# Patient Record
Sex: Male | Born: 1985 | Race: White | Hispanic: Yes | Marital: Married | Smoking: Never smoker
Health system: Southern US, Community
[De-identification: ages and names within clinical notes are randomized; demographics above are authoritative.]

## PROBLEM LIST (undated history)

## (undated) DIAGNOSIS — R569 Unspecified convulsions: Secondary | ICD-10-CM

---
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
## 2007-12-30 ENCOUNTER — Emergency Department: Payer: Self-pay | Admitting: Internal Medicine

## 2008-04-21 ENCOUNTER — Emergency Department: Payer: Self-pay | Admitting: Emergency Medicine

## 2008-05-04 ENCOUNTER — Ambulatory Visit: Payer: Self-pay | Admitting: Internal Medicine

## 2008-05-25 ENCOUNTER — Emergency Department: Payer: Self-pay | Admitting: Emergency Medicine

## 2008-05-26 ENCOUNTER — Emergency Department: Payer: Self-pay | Admitting: Emergency Medicine

## 2008-08-09 ENCOUNTER — Emergency Department: Payer: Self-pay | Admitting: Emergency Medicine

## 2008-08-13 ENCOUNTER — Emergency Department: Payer: Self-pay | Admitting: Emergency Medicine

## 2008-09-10 ENCOUNTER — Emergency Department: Payer: Self-pay | Admitting: Emergency Medicine

## 2008-11-23 ENCOUNTER — Emergency Department: Payer: Self-pay | Admitting: Emergency Medicine

## 2008-12-12 ENCOUNTER — Other Ambulatory Visit: Payer: Self-pay | Admitting: Ophthalmology

## 2008-12-17 ENCOUNTER — Emergency Department: Payer: Self-pay | Admitting: Emergency Medicine

## 2009-01-23 ENCOUNTER — Emergency Department: Payer: Self-pay | Admitting: Emergency Medicine

## 2009-10-03 ENCOUNTER — Emergency Department: Payer: Self-pay | Admitting: Emergency Medicine

## 2009-10-19 ENCOUNTER — Emergency Department: Payer: Self-pay | Admitting: Emergency Medicine

## 2009-11-09 ENCOUNTER — Emergency Department: Payer: Self-pay | Admitting: Emergency Medicine

## 2010-04-01 ENCOUNTER — Inpatient Hospital Stay: Payer: Self-pay | Admitting: Internal Medicine

## 2010-07-11 ENCOUNTER — Emergency Department: Payer: Self-pay | Admitting: Internal Medicine

## 2010-07-14 ENCOUNTER — Emergency Department: Payer: Self-pay | Admitting: Emergency Medicine

## 2011-01-27 ENCOUNTER — Emergency Department: Payer: Self-pay | Admitting: Psychiatry

## 2011-03-19 ENCOUNTER — Emergency Department: Payer: Self-pay | Admitting: Emergency Medicine

## 2011-05-14 ENCOUNTER — Emergency Department: Payer: Self-pay | Admitting: Emergency Medicine

## 2011-08-29 ENCOUNTER — Emergency Department: Payer: Self-pay | Admitting: Emergency Medicine

## 2011-08-29 LAB — APTT: Activated PTT: 38.7 secs — ABNORMAL HIGH (ref 23.6–35.9)

## 2011-08-29 LAB — URINALYSIS, COMPLETE
Bacteria: NONE SEEN
Bilirubin,UR: NEGATIVE
Glucose,UR: NEGATIVE mg/dL (ref 0–75)
Leukocyte Esterase: NEGATIVE
RBC,UR: 1 /HPF (ref 0–5)

## 2011-08-29 LAB — PROTIME-INR: INR: 1.1

## 2011-08-29 LAB — COMPREHENSIVE METABOLIC PANEL
Albumin: 4 g/dL (ref 3.4–5.0)
Alkaline Phosphatase: 75 U/L (ref 50–136)
Anion Gap: 9 (ref 7–16)
BUN: 19 mg/dL — ABNORMAL HIGH (ref 7–18)
Bilirubin,Total: 0.7 mg/dL (ref 0.2–1.0)
Calcium, Total: 8.7 mg/dL (ref 8.5–10.1)
Chloride: 105 mmol/L (ref 98–107)
EGFR (Non-African Amer.): >60
Glucose: 112 mg/dL — ABNORMAL HIGH (ref 65–99)
Potassium: 3.9 mmol/L (ref 3.5–5.1)
SGOT(AST): 30 U/L (ref 15–37)
SGPT (ALT): 67 U/L

## 2011-08-29 LAB — CBC
HCT: 47 % (ref 40.0–52.0)
MCHC: 33.5 g/dL (ref 32.0–36.0)
MCV: 91 fL (ref 80–100)
Platelet: 174 10*3/uL (ref 150–440)
RDW: 13.4 % (ref 11.5–14.5)

## 2012-01-19 ENCOUNTER — Emergency Department: Payer: Self-pay | Admitting: Emergency Medicine

## 2012-08-07 ENCOUNTER — Inpatient Hospital Stay: Payer: Self-pay | Admitting: Internal Medicine

## 2012-08-07 LAB — URINALYSIS, COMPLETE
Bilirubin,UR: NEGATIVE
Glucose,UR: NEGATIVE mg/dL (ref 0–75)
Ketone: NEGATIVE
Nitrite: NEGATIVE
Ph: 5 (ref 4.5–8.0)
Protein: NEGATIVE
RBC,UR: 3 /HPF (ref 0–5)
WBC UR: 10 /HPF (ref 0–5)

## 2012-08-07 LAB — COMPREHENSIVE METABOLIC PANEL
Albumin: 4 g/dL (ref 3.4–5.0)
Alkaline Phosphatase: 104 U/L (ref 50–136)
BUN: 14 mg/dL (ref 7–18)
Calcium, Total: 9 mg/dL (ref 8.5–10.1)
Co2: 26 mmol/L (ref 21–32)
Osmolality: 273 (ref 275–301)
SGPT (ALT): 45 U/L (ref 12–78)

## 2012-08-07 LAB — CBC
HCT: 43.9 % (ref 40.0–52.0)
HGB: 15.2 g/dL (ref 13.0–18.0)
MCHC: 34.5 g/dL (ref 32.0–36.0)
MCV: 90 fL (ref 80–100)
RDW: 13.1 % (ref 11.5–14.5)
WBC: 15.2 10*3/uL — ABNORMAL HIGH (ref 3.8–10.6)

## 2012-08-07 LAB — LIPASE, BLOOD: Lipase: 84 U/L (ref 73–393)

## 2012-08-08 LAB — CBC WITH DIFFERENTIAL/PLATELET
Basophil #: 0 10*3/uL (ref 0.0–0.1)
Basophil %: 0.1 %
Eosinophil #: 0 10*3/uL (ref 0.0–0.7)
Lymphocyte #: 1.6 10*3/uL (ref 1.0–3.6)
Lymphocyte %: 10.7 %
MCH: 31 pg (ref 26.0–34.0)
Monocyte #: 1.4 x10 3/mm — ABNORMAL HIGH (ref 0.2–1.0)
Neutrophil #: 11.6 10*3/uL — ABNORMAL HIGH (ref 1.4–6.5)
Neutrophil %: 79.5 %
RBC: 4.71 10*6/uL (ref 4.40–5.90)
RDW: 13.1 % (ref 11.5–14.5)
WBC: 14.6 10*3/uL — ABNORMAL HIGH (ref 3.8–10.6)

## 2012-08-08 LAB — TROPONIN I: Troponin-I: <0.02 ng/mL

## 2012-08-08 LAB — BASIC METABOLIC PANEL
BUN: 14 mg/dL (ref 7–18)
Chloride: 99 mmol/L (ref 98–107)
Co2: 28 mmol/L (ref 21–32)
EGFR (African American): >60
Potassium: 4 mmol/L (ref 3.5–5.1)

## 2012-08-09 LAB — CBC WITH DIFFERENTIAL/PLATELET
Basophil #: 0 10*3/uL (ref 0.0–0.1)
Eosinophil #: 0 10*3/uL (ref 0.0–0.7)
HCT: 38.6 % — ABNORMAL LOW (ref 40.0–52.0)
Lymphocyte #: 1.8 10*3/uL (ref 1.0–3.6)
Lymphocyte %: 12.9 %
MCH: 31.1 pg (ref 26.0–34.0)
MCV: 90 fL (ref 80–100)
Monocyte #: 1.4 x10 3/mm — ABNORMAL HIGH (ref 0.2–1.0)
Monocyte %: 10 %
Neutrophil %: 76.6 %
Platelet: 184 10*3/uL (ref 150–440)
RBC: 4.3 10*6/uL — ABNORMAL LOW (ref 4.40–5.90)
RDW: 12.9 % (ref 11.5–14.5)
WBC: 13.7 10*3/uL — ABNORMAL HIGH (ref 3.8–10.6)

## 2012-08-09 LAB — URINE CULTURE

## 2012-08-13 LAB — WOUND CULTURE

## 2012-08-13 LAB — CULTURE, BLOOD (SINGLE)

## 2012-08-31 ENCOUNTER — Emergency Department: Payer: Self-pay | Admitting: Emergency Medicine

## 2013-03-25 ENCOUNTER — Emergency Department: Payer: Self-pay | Admitting: Emergency Medicine

## 2013-03-25 LAB — URINALYSIS, COMPLETE
Bacteria: NONE SEEN
Bilirubin,UR: NEGATIVE
Blood: NEGATIVE
Ketone: NEGATIVE
Leukocyte Esterase: NEGATIVE
Nitrite: NEGATIVE
Protein: NEGATIVE

## 2013-06-10 ENCOUNTER — Emergency Department: Payer: Self-pay | Admitting: Emergency Medicine

## 2014-02-02 ENCOUNTER — Emergency Department: Payer: Self-pay | Admitting: Emergency Medicine

## 2014-02-18 ENCOUNTER — Emergency Department: Payer: Self-pay | Admitting: Internal Medicine

## 2014-07-21 NOTE — Consult Note (Signed)
PATIENT NAME:  Daniel Contreras, Daniel Contreras MR#:  [id number] DATE OF BIRTH:  1985/11/07  DATE OF CONSULTATION:  08/07/2012  CONSULTING PHYSICIAN:  Loraine LericheMark A. Egbert GaribaldiBird, MD  REASON FOR CONSULTATION: Fever, leukocytosis and anal pain.   HISTORY: This is a 29 year old otherwise healthy white male who presents with a 3-day history of worsening perianal pain, difficulty having bowel movement. He has had some intermittent dysuria as well. The patient has never had symptoms like this before. The patient had a fever to 101.3. A CAT scan in the Emergency Room demonstrates what appears to be a perianal abscess. The patient is very tender on examination to both myself and the Emergency Room physician.   ALLERGIES: None.  HOME MEDICATIONS:  None.  PAST MEDICAL HISTORY: None.   SOCIAL HISTORY: He is married, works in [employer], is Hispanic.   PHYSICAL EXAMINATION: GENERAL: The patient is disheveled. No obvious distress, in slight pain.  VITAL SIGNS: Temperature is 101.3, pulse 101, respiratory rate 18, blood pressure 127/67, 5 feet 8 inches, 177 pounds.  LUNGS: Clear.  HEART: Regular rate and rhythm except for tachycardia.  ABDOMEN: Soft and nontender.  RECTAL: Examination was limited secondary to pain. There is exquisite tenderness on superficial palpation to the left lateral perianal sidewall. No obvious perianal abscess. No obvious cellulitis seen.   LABORATORY, DIAGNOSTIC AND RADIOLOGICAL DATA:  Electrolytes are unremarkable. Liver function tests normal. White count is 15.2, platelet count 197,000. Urinalysis is negative except for 10 WBCs per high-power field.   IMPRESSION:  Perianal abscess, likely  intersphincteric in location, significant symptoms, leukocytosis and fever.   PLAN: The patient has been admitted to the medical service for intravenous antibiotics and pain control. Plan is for incision and drainage, examination under anesthesia in the morning. I discussed with him the procedure, how it was  done under general anesthesia and all his questions were answered.   ____________________________ Redge GainerMark A. Egbert GaribaldiBird, MD mab:cb D: 08/07/2012 21:38:20 ET T: 08/08/2012 14:18:19 ET JOB#: [id number]  cc: Loraine LericheMark A. Egbert GaribaldiBird, MD, <Dictator> Lowanda Cashaw A Malacai Grantz MD ELECTRONICALLY SIGNED 08/08/2012 20:29

## 2014-07-21 NOTE — H&P (Signed)
PATIENT NAME:  Daniel Contreras, Daniel Contreras MR#:  [id number] DATE OF BIRTH:  May 21, 1985  DATE OF ADMISSION:  08/07/2012  REFERRING PHYSICIAN: Dr. Margarita Grizzle.   PRIMARY CARE PHYSICIAN: None.    CHIEF COMPLAINT: Perirectal pain, and severe constipation.   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 Last seizure over a year ago.   The patient comes today with a history starting with rectal pain for 3 or 4 days, increasing in intensity and duration. Has not gone away. Intensity of 9 out of 10. The patient is unable to have a bowel movement due to the severity of the pain. He states he has not had a bowel movement in over 4 days.   The patient states that he has been having fevers at home, chills and feeling very poorly.   He has pains all over, especially when he has the chills.   The patient overall was evaluated by the ER physician. Rectal exam shows significant tenderness at the level of the anterior and posterior rectal walls.   Because of all of the severity of the symptoms and significant pain, the patient got a CT scan. The CT scan showed a normal-sized prostate with a loculated perirectal abscess on the posterior wall of the rectum.   The patient states that he has not been eating any abnormal foods. He occasionally eats fish or chicken. Has not really felt like eating due to fullness/ constipation. and he has not changed his diet significantly. He works in [employer], and he says that he drinks some fluids occasionally, but  not enough. The patient is admitted for further evaluation. Surgery was consulted as well.   REVIEW OF SYSTEMS:   CONSTITUTIONAL: Positive fever. Positive malaise. Positive myalgias. Positive dehydration. Negative weight loss or weight gain.  EYES: No double vision or blurry vision; inflammation of the eyes.  ENT: No tinnitus. No  difficulty in swallowing. No significant snoring.  RESPIRATORY: No cough. No wheezing. No hemoptysis. No painful respirations.  CARDIOVASCULAR: No chest pain, orthopnea, edema or syncopal episodes.  GASTROINTESTINAL: No nausea or vomiting. No diarrhea. Positive constipation. Positive perirectal pain. No previous hematemesis, rectal bleeding. No jaundice.  GENITOURINARY: No hematuria. Positive mild dysuria. No history of kidney stones. No changes in frequency. No previous history of prostatitis. No history of STDs in the past. The patient states that he has had only 1 sexual partner for several years, who is his wife who is currently pregnant.  ENDOCRINOLOGIC: No polyuria, polydipsia or polyphagia. No cold or heat intolerance. HEMATOLOGIC/LYMPHATIC: No anemia, easy bruising or bleeding.  SKIN: No rashes or petechiae.  MUSCULOSKELETAL: No significant back pain, neck pain. Positive myalgias all over. No gout.  NEUROLOGIC: No numbness, tingling. No CVAs or transient ischemic attacks.  PSYCHIATRIC: No significant anxiety or depression or history of other psychiatric issues.   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 The patient himself out of treatment.   ALLERGIES: No known drug allergies.   MEDICATIONS: The patient takes occasional ibuprofen for pain.   SOCIAL HISTORY: The patient does not smoke, does not drink. He works in [employer]. He has his wife, 1 baby with her, and she is now pregnant.   SURGICAL HISTORY: The patient states that he had surgery on his foot with soft tissue of the foot, but he does not know what type of surgery it was.  FAMILY HISTORY: Negative for MI. Positive for a stroke in his mother. Negative for diabetes.   PHYSICAL EXAMINATION  VITAL SIGNS: Temperature of 101.3, pulse of 101, respirations 18, blood pressure 126/73, oxygen saturation 98% on room air.   GENERAL: The patient is alert, oriented x 3, mild distress due  to significant pain. Hemodynamically stable. The patient looks slightly dehydrated.  HEENT: Pupils are equal and reactive. Extraocular movements are intact. Mucosa are moist. Anicteric sclerae. Pink conjunctivae. No oral lesions. No oropharyngeal exudates.  NECK: Supple. No JVD. No thyromegaly. No adenopathy. No carotid bruits. No rigidity.  CARDIOVASCULAR: Regular rate and rhythm. No murmurs, rubs, or gallops. No tenderness to palpation of anterior chest wall.  LUNGS: Clear, without any wheezing or crepitus. No use of accessory muscles.  ABDOMEN: Soft, nontender, nondistended. No hepatosplenomegaly. No masses. Bowel sounds are positive.  RECTAL EXAM: Deferred, as he already had a rectal exam done by ER physician. By ER physician, prostate and posterior wall was really tender. No cellulitis outside the rectum. No hemorrhoids.  EXTREMITIES: No edema, no cyanosis, no clubbing. Pulses +2. Capillary refill less than 3.  SKIN: Without any rashes or petechiae. There is some flushing at the level of the face and neck, likely due to fever.  LYMPHATICS: Negative for lymphadenopathy in the neck or supraclavicular areas, or groin area.  NEUROLOGIC: Cranial nerves II-XII intact. Strength is 5/5 in all 4 extremities.  MOOD: The patient is stable. Alert, oriented x 3. No agitation.   RESULTS: Glucose 105, BUN 14, creatinine 0.95, chloride 104, potassium 3.5, osmolality 273, total protein 7.8.   White count is 15,000, hemoglobin is 15, platelets 197.   UA only shows 10 red blood cells. Leukocyte esterase is trace.   CT scan, as mentioned above, perirectal abscess.   ASSESSMENT AND PLAN: A 29 year old gentleman with a history of previous seizures, no longer on treatment, comes with perirectal pain, fever, tachycardia, increased white blood cells, significant pain.   1.  Systemic inflammatory response syndrome: The patient meet criteria based on  tachycardia, increased white blood cells and fever. This is  likely due to perirectal abscess. Patient covered  with broad-spectrum antibiotics. The patient has been given Levaquin because of suspicion of prostatitis, but now with CT scan with abscessed, would like to bring up the coverage and use Zosyn.  2.  Surgical consultation for pain.  3.  The patient has a perirectal abscess at this moment. The amount of fluid may be small to moderate. We are going to continue to treat with IV antibiotics. Blood cultures taken. Try to give comfort with pain medications; morphine, nausea medications and alleviate this significant constipation. The patient needs to have release of stool, for which what I am going to add on MiraLAX twice daily.  4.  For now, the patient is going to be n.p.o. except for medications in the light of getting a surgical consultation.  5. History of seizures in the past: The patient is to follow up with primary care physician and primary care neurologist to get a new evaluation if he needs to be on or off this medication if  he was on Keppra.  6.  DVT prophylaxis: Mechanical compression devices, as the patient could have surgery.  7.  GI prophylaxis, with Protonix.   I spent about 45 minutes with this patient.    ____________________________ Felipa Furnaceoberto Sanchez Gutierrez, MD rsg:dm D: 08/07/2012 21:04:17 ET T: 08/08/2012 07:30:01 ET JOB#: [id number]  cc: Felipa Furnaceoberto Sanchez Gutierrez, MD, <Dictator> Neilani Duffee  Juanda Chance MD ELECTRONICALLY SIGNED 08/09/2012 12:11

## 2014-07-21 NOTE — Op Note (Signed)
PATIENT NAME:  Daniel Contreras, Daniel Contreras MR#:  [id number] DATE OF BIRTH:  04-23-1985  DATE OF PROCEDURE:  08/08/2012  PREOPERATIVE DIAGNOSIS: Perirectal abscess.  POSTOPERATIVE DIAGNOSIS:  Perirectal abscess.  PROCEDURE:  Incision and drainage of perirectal abscess, examination under anesthesia.   SURGEON: Dionne Miloichard Kelyn Koskela, M.D.   ANESTHESIA: General with LMA.   INDICATIONS: This is a patient with painful right-sided mass, which has worsened, and a CT scan suggestive of perirectal abscess. Preoperatively, we discussed rationale for surgery, the options of observation, risk of bleeding, infection and recurrence. He understood and agreed to proceed.   FINDINGS: Small abscess cavity. Cultures were obtained. This was on the right side. Packing was placed.   DESCRIPTION OF PROCEDURE: The patient was induced to general anesthesia, placed into a high lithotomy position, and then prepped and draped in sterile fashion.  A rectal exam was performed, which was normal. There seemed to be some mass affect on the right side.  However, this coincided with mass on the right perianal area.   Incision and drainage was performed with a 10 blade.  A small amount of purulence exuded and clamp was placed into a small cavity through this wound.  A small amount of purulence exuded. Cultures were obtained.  Into this cavity was placed 1/2 inch Nu Gauze dressing and ABD pad was placed. He was taken out of lithotomy position and mesh panties were placed.   The patient tolerated the procedure well. There were no complications. He was taken to the recovery room in stable condition to be admitted for continued care.  ____________________________ Adah Salvageichard E. Excell Seltzerooper, MD rec:sb D: 08/08/2012 10:15:47 ET T: 08/09/2012 07:51:34 ET JOB#: [id number]  cc: Adah Salvageichard E. Excell Seltzerooper, MD, <Dictator> Lattie HawICHARD E Chrystian Cupples MD ELECTRONICALLY SIGNED 08/11/2012 14:44

## 2014-07-21 NOTE — Discharge Summary (Signed)
PATIENT NAME:  Daniel LaymanJAIMES SILVA, [place] MR#:  [id number] DATE OF BIRTH:  March 09, 1986  DATE OF ADMISSION:  08/07/2012 DATE OF DISCHARGE:    DISCHARGE DIAGNOSES: 1.  A 1.6 and 1.8 cm perirectal abscess.  2.  Sepsis.  3.  Constipation.   IMAGING STUDIES: Includes a CT scan of the abdomen and pelvis with contrast, which showed perirectal abscess.   CONSULTANTS: Dr. Excell Seltzerooper and Dr. Juliann PulseLundquist of surgery.   PROCEDURES: I and D of the perirectal abscess.   ADMITTING HISTORY AND PHYSICAL AND HOSPITAL COURSE: Please see detailed H and P dictated previously. In brief, a 29 year old Spanish speaking Hispanic male presented to the hospital complaining of pain around his rectal area along with constipation. The patient was found to have a swelling. CT scan showed perirectal abscess. The patient was admitted for IV antibiotics and I and D was done by Dr. Excell Seltzerooper of surgery. Later, he was followed by Dr. Juliann PulseLundquist of surgery who suggested no packing of the wound. Follow up with him in 7 to 10 days as outpatient. The patient was initially on Zosyn, later switched to Augmentin. The patient is doing well with sepsis resolved. Constipation has resolved with MiraLAX and Colace. He is being discharged home. Today, his cardiac and lung examination showed clear breath sounds and S1, S2, without any murmurs. Afebrile.   DISCHARGE MEDICATIONS: 1.  Acetaminophen 325 mg 2 tablets every 4 hours as needed for pain or temperature.  2. Acetaminophen/hydrocodone 325/5, one tablet orally every 6 hours as needed for pain.  3.  Docusate sodium 100 mg 2 capsules oral 2 times a day as needed.  4.  MiraLAX 17 grams oral once a day as needed for constipation.  5.  Augmentin 875 mg 1 tablet oral 2 times a day.   DISCHARGE INSTRUCTIONS: Regular diet with high fiber. Avoid any kind of constipation and use laxatives as prescribed. The patient has been given a work note and will be off work until 08/16/2012.   TIME SPENT TODAY ON THIS  DISCHARGE ACTIVITY: Was 41 minutes   ____________________________ Molinda BailiffSrikar R. Pruitt Taboada, MD srs:cc D: 08/10/2012 15:14:58 ET T: 08/10/2012 17:18:30 ET JOB#: [id number]  cc: Wardell HeathSrikar R. Lum Stillinger, MD, <Dictator> Orie FishermanSRIKAR R Rachella Basden MD ELECTRONICALLY SIGNED 08/11/2012 14:00

## 2014-07-21 NOTE — Consult Note (Signed)
Brief Consult Note: Diagnosis: perianal abscess, likley intersphinecteric in location, leukocytosis.   Patient was seen by consultant.   Consult note dictated.   Recommend further assessment or treatment.   Discussed with Attending MD.   Comments: Best drained in the OR.  SPoke with patient and he agrees plan for I and D in am under GETA.  Electronic Signatures: Natale LayBird, Yoltzin Barg (MD)  (Signed 10-May-14 21:35)  Authored: Brief Consult Note   Last Updated: 10-May-14 21:35 by Natale LayBird, Rajvir Ernster (MD)

## 2014-10-16 ENCOUNTER — Encounter: Payer: Self-pay | Admitting: Emergency Medicine

## 2014-10-16 ENCOUNTER — Emergency Department: Payer: Self-pay

## 2014-10-16 ENCOUNTER — Emergency Department
Admission: EM | Admit: 2014-10-16 | Discharge: 2014-10-16 | Disposition: A | Discharge status: Home / Self Care | Payer: Self-pay | Attending: Emergency Medicine | Admitting: Emergency Medicine

## 2014-10-16 DIAGNOSIS — R519 Headache, unspecified: Secondary | ICD-10-CM

## 2014-10-16 DIAGNOSIS — R51 Headache: Secondary | ICD-10-CM | POA: Insufficient documentation

## 2014-10-16 MED ORDER — BUTALBITAL-APAP-CAFFEINE 50-325-40 MG PO TABS: 1.0000 | ORAL_TABLET | Freq: Four times a day (QID) | ORAL | Status: AC | PRN

## 2014-10-16 MED ORDER — KETOROLAC TROMETHAMINE 30 MG/ML IJ SOLN
30.0000 mg | Freq: Once | INTRAMUSCULAR | Status: AC
  Administered 2014-10-16: 30 mg via INTRAMUSCULAR
  Filled 2014-10-16: qty 1

## 2014-10-16 NOTE — Discharge Instructions (Signed)
Dolor de cabeza, preguntas frecuentes y sus respuestas °(Headaches, Frequently Asked Questions) °CEFALEAS MIGRAÑOSAS °P: ¿Qué es la migraña? ¿Qué la ocasiona? ¿Cómo puedo tratarla? °R: En general, la migraña comienza como un dolor apagado. Luego progresa hacia un dolor, constante, punzante y como un latido. Sentirá este dolor en las sienes. Podrá sentir dolor en la parte anterior o posterior de la cabeza, o en uno o ambos lados. El dolor suele estar acompañado de una combinación de: °· Náuseas. °· Vómitos. °· Sensibilidad a la luz y los ruidos. °Algunas personas (un 15%) experimentan un aura (ver abajo) antes de un ataque. La causa de la migraña se debe a reacciones químicas del cerebro. El tratamiento para la migraña puede incluir medicamentos de venta libre. También puede incluir técnicas de autoayuda. Estas incluyen entrenamientos para la relajación y biorretroalimentación.  °P: ¿Qué es un aura? °R: Alrededor del 15% de las personas con migraña tiene un "aura". Es una señal de síntomas neurológicos que ocurren antes de un dolor de cabeza por migrañas. Podrá ver líneas onduladas o irregulares, puntos o luces parpadeantes. Podrá experimentar visión de túnel o puntos ciegos en uno o ambos ojos. El aura puede incluir alucinaciones visuales o auditivas (algo que se imagina). Puede incluir trastornos en el olfato (como olores extraños), el tacto o el gusto. Entre otros síntomas se incluyen: °· Adormecimiento. °· Sensación de hormigueo. °· Dificultad para recordar o decir la palabra correcta. °Estos episodios neurológicos pueden durar hasta 60 minutos. Los síntomas desaparecerán a medida que el dolor de cabeza comience. °P:¿Qué es un disparador? °R: Ciertos factores físicos o ambientales pueden llevar a "disparar" una migraña. Estos son: °· Alimentos. °· Cambios hormonales. °· Clima. °· Estrés. °Es importante recordar que los disparadores son diferentes entre si. Para ayudar a prevenir ataques de migrañas, necesitará  descubrir cuáles son los disparadores que le afectan. Lleve un diario sobre sus dolores de cabeza. Este es un buen modo para descubrir los disparadores. El diario le ayudará en el momento de hablar con el profesional acerca de su enfermedad. °P: ¿El clima afecta en las migrañas? °R: La luz solar, el calor, la humedad y lo cambios drásticos en la presión barométrica pueden llevar a, o "disparar" un ataque de migraña en algunas personas. Pero estudios han demostrado que el clima no actúa como disparador para todas las personas con migraña. °P: ¿Cuál es la relación entre la migraña y la hormonas? °R: Las hormonas inician y regulan muchas de las funciones corporales. Las hormonas mantienen el balance en el cuerpo dentro de los constantes cambios de ambiente. Algunas veces, el nivel de hormonas en el cuerpo se desbalancea. Por ejemplo, durante la menstruación, el embarazo o la menopausia. Pueden ser la causa de un ataque de migraña. De hecho, alrededor de tres cuartos de las mujeres con migraña informan que sus ataques están relacionados con el ciclo menstrual.  °P: ¿Aumenta el riesgo de sufrir un choque cardíaco en las personas que padecen migraña? °R: La probabilidad de que un ataque de migraña ocasione un ataque cardíaco es muy remota. Esto no quiere decir que una persona que sufre de migraña no pueda tener un ataque cardíaco asociado con ella. En las personas menores de 40 años, el factor más común para un ataque es la migraña. Pero durante la vida de una persona, la ocurrencia de un dolor de cabeza por migraña está asociada con una reducción en el riesgo de morir por un ataque cerebrovascular.  °P: ¿Cuáles son los medicamentos para la migraña? °R: La   medicación precisa se utiliza para tratar el dolor de cabeza una vez que ha comenzado. Son ejemplos, medicamentos de venta libre, desinflamatorios sin esteroides, ergotamínicos y triptanos.  °P: ¿Qué son los triptanos? °R: Lo triptanos son una nueva clase de  medicamentos abortivos. Son específicos para tratar este problema. Los triptanos son vasoconstrictores. Moderan algunas reacciones químicas del cerebro. Los triptanos trabajan como receptores del cerebro. Ayudan a restaurar el balance de un neurotransmisor denominado serotonina. Se cree que las fluctuaciones en los niveles de serotonina son la causa principal de la migraña.  °P: ¿Son efectivos los medicamentos de venta libre para la migraña? °R: Los medicamentos de venta libre pueden ser efectivos para aliviar dolores leves a moderados y los síntomas asociados a la migraña. Pero deberá consultar a un médico antes de comenzar cualquier tratamiento para la migraña.  °P: ¿Cuáles son los medicamentos de prevención de la migraña? °R: Se suele denominar tratamiento "profiláctico" a los medicamentos para la prevención de la migraña. Se utilizan para reducir la frecuencia, gravedad y duración de los ataques de migraña. Son ejemplos de medicamentos de prevención: antiepilépticos, antidepresivos, bloqueadores beta, bloqueadores de los canales de calcio y medicamentos antiinflamatorios sin esteroides. °P: ¿ Por qué se utilizan anticonvulsivantes para tratar la migraña? °R: Durante los últimos años, ha habido un creciente interés en las drogas antiepilépticas para la prevención de la migraña. A menudo se los conoce como "anticonvulsivantes". La epilepsia y la migraña suceden por reacciones similares en el cerebro.  °P: ¿ Por qué se utilizan antidepresivos para tratar la migraña? °R: Los antidepresivos típicamente se utilizan para tratar a las personas con depresión. Pueden reducir la frecuencia de la migraña a través de la regulación de los niveles químicos, como la serotonina, en el cerebro.  °P: ¿ Por qué se utilizan terapias alternativas para tratar la migraña? °R: El término "terapias alternativas" suelen utilizarse para describir los tratamientos que se considera que están por fuera de alcance la medicina occidental  convencional. Son ejemplos de las terapias alternativas: la acupuntura, la acupresión y el yoga. Otra terapia alternativa común es la terapia herbal. Se cree que algunas hierbas ayudan a aliviar los dolores de cabeza. Siempre consulte con el profesional acerca de las terapias alternativas antes de utilizarlas. Algunos productos herbales contienen arsénico y otras toxinas. °DOLORES DE CABEZA POR TENSIÓN °P: ¿Qué es un dolor de cabeza por tensión? ¿Qué lo ocasiona? ¿Cómo puedo tratarlo? °R: Los dolores de cabeza por tensión ocurren al azar. A menudo son el resultado de estrés temporario, ansiedad, fatiga o ira. Los síntomas incluyen dolor en las sienes, una sensación como de tener una banda alrededor de la cabeza (un dolor que "presiona"). Los síntomas pueden incluir una sensación de empuje, de presión y contracción de los músculos de la cabeza y el cuello. El dolor comienza en la frente, sienes o en la parte posterior de la cabeza y el cuello. El tratamiento para los dolores de cabeza por tensión puede incluir medicamentos de venta libre. También puede incluir técnicas de autoayuda con entrenamientos para la relajación y biorretroalimentación. °CEFALEA EN RACIMOS °P: ¿Qué es una cefalea en racimos? ¿Qué la ocasiona? ¿Cómo puedo tratarla? °R: La cefalea en racimos toma su nombre debido a que los ataques vienen en grupos. El dolor aparece con poco o ningún aviso. Normalmente ocurre de un lado de la cabeza. Muchas veces el dolor viene acompañado de un lagrimeo u ojo rojo y goteo de la nariz del mismo lado que el dolor. Se cree que la causa es   una reacción en las sustancias químicas del cerebro. Se describe como el caso más grave e intenso de cualquier tipo de dolor de cabeza. El tratamiento incluye medicamentos bajo receta y oxígeno. °CEFALEA SINUSAL °P: ¿Qué es una cefalea sinusal? ¿Qué la ocasiona? ¿Cómo puedo tratarla? °R: Cuando se inflama una cavidad en los huesos de la cara y el cráneo (sinus) ocasiona un dolor  localizado. Esta enfermedad generalmente es el resultado de una reacción alérgica, un tumor o una infección. Si el dolor de cabeza está ocasionado por un bloqueo del sinus, como una infección, probablemente tendrá fiebre. Una imagen de rayos X confirmará el bloqueo del sinus. El tratamiento indicado por el médico podrá incluir antibióticos para la infección, y también antihistamínicos o descongestivos.  °DOLOR DE CABEZA POR EFECTO "REBOTE" °P: ¿Qué es un dolor de cabeza por efecto "rebote"? ¿Qué lo ocasiona? ¿Cómo puedo tratarlo? °R: Si se toman medicamentos para el dolor de cabeza muy a menudo puede llevar a la enfermedad conocida como "dolor de cabeza por rebote". Un patrón de abuso de medicamentos para el dolor de cabeza supone tomarlos más de dos veces por semana o en cantidades excesivas. Esto significa más que lo que indica el envase o el médico. Con los dolores de cabeza por rebote, los medicamentos no sólo dejan de aliviar el dolor sino que además comienzan a ocasionar dolores de cabeza. Los médicos tratan los dolores de cabeza por rebote mediante la disminución del medicamento del que se ha abusado. A veces el medico podrá sustituir gradualmente por un tipo diferente de tratamiento o medicación. Dejar de consumirlo podría ser difícil. El abuso regular de un medicamento aumenta el potencial que se produzcan efectos secundarios graves. Consulte con un médico si utiliza regularmente medicamentos para el dolor de cabeza más de dos días por semana o más de lo que indica el envase. °PREGUNTAS Y RESPUESTAS ADICIONALES °P: ¿Qué es la biorretroalimentación? °R: La biorretroalimentación es un tratamiento de autoayuda. La biorretroalimentación utiliza un equipamiento especial para controlar los movimientos involuntarios del cuerpo y las respuestas físicas. La biorretroalimentación controla: °· Respiración. °· Pulso. °· Latidos cardíacos. °· Temperatura. °· Tensión muscular. °· Actividad cerebrales. °La  biorretroalimentación le ayudará a mejorar y perfeccionar sus ejercicios de relajación. Aprenderá a controlar las respuestas físicas relacionadas con el estrés. Una vez que se dominan las técnicas no necesitará más el equipamiento. °P: ¿Son hereditarios los dolores de cabeza? °R: Según algunas estimaciones, aproximadamente 28 millones de estadounidenses sufren migraña. Cuatro de cada cinco (80%) informan una historia familiar de migraña. Los investigadores no pueden asegurar si se trata de un problema genético o una predisposición familiar. A pesar de esto, un niño tiene 50% de probabilidades de sufrir migraña si uno de sus padres la sufre. El niño tiene un 75% de probabilidades si ambos padres la sufren.  °P. ¿Puede un niño tener migraña? °R: En el momento de ingresar a la escuela secundaria, la mayoría de los jóvenes han experimentado algún tipo de cefalea. Algunos abordajes o medicamentos seguros y efectivos pueden evitar las cefaleas o detenerlas luego de que han comenzado.  °P. ¿Qué tipo de especialista debe ver para diagnosticar y tratar una cefalea? °R: Comience con su médico de cabecera. Converse acerca de su experiencia y abordaje de las cefaleas. Comente los métodos de clasificación, diagnóstico y tratamiento. El profesional decidirá si lo derivará a un especialista, según los síntomas u otras enfermedades. El hecho de sufrir diabetes, alergias, etc, puede requerir un abordaje más complejo. La [employer] (Fund[employer] Nacional   para las Cefaleas) proporcionará, a pedido, una lista de los médicos que son miembros de su estado. °Document Released: 02/28/2008 Document Revised: 06/09/2011 °[employer]® Patient Information ©2015 [employer]. This information is not intended to replace advice given to you by your health care provider. Make sure you discuss any questions you have with your health care provider. ° °

## 2014-10-16 NOTE — ED Notes (Signed)
Pt is c/o of 7/10 head ache.  He also states that he is having some tingling in right hand and right foot.  Dr Cyril LoosenKinner at bedside to reevaluate, strength is normal, CT was ordered.

## 2014-10-16 NOTE — ED Notes (Signed)
Headache x3 day no better, nausea, hx of same

## 2014-10-16 NOTE — ED Notes (Signed)
Pt presents with headache since Saturday with some nausea. Pt with hx of headaches. No acute distress noted. Vss.

## 2014-10-16 NOTE — ED Provider Notes (Signed)
[facility] Emergency Department Provider Note  ____________________________________________  Time seen: On arrival  I have reviewed the triage vital signs and the nursing notes.   HISTORY  Chief Complaint Headache    HPI Daniel Contreras is a 29 y.o. male presents with a headache that started about 3 days ago. He has a history of similar headaches and reports that over-the-counter medications are not helping. He reports some nausea. He reports the headache was gradual in onset. It is not the worst ever had. He denies neuro deficits. No fevers. No neck pain. No other complaints     History reviewed. No pertinent past medical history.  There are no active problems to display for this patient.   History reviewed. No pertinent past surgical history.  No current outpatient prescriptions on file.  Allergies Review of patient's allergies indicates no known allergies.  No family history on file.  Social History History  Substance Use Topics  . Smoking status: Never Smoker   . Smokeless tobacco: Not on file  . Alcohol Use: No    Review of Systems  Constitutional: Negative for fever. Eyes: Negative for visual changes. ENT: Negative for sore throat Cardiovascular: Negative for chest pain. Respiratory: Negative for shortness of breath. Gastrointestinal: Negative for abdominal pain, vomiting and diarrhea.  Musculoskeletal: Negative for back pain. Skin: Negative for rash. Neurological: Negative forr focal weakness Psychiatric no anxiety  10-point ROS otherwise negative.  ____________________________________________   PHYSICAL EXAM:  VITAL SIGNS: ED Triage Vitals  Enc Vitals Group     BP 10/16/14 1234 128/90 mmHg     Pulse Rate 10/16/14 1234 72     Resp 10/16/14 1234 20     Temp 10/16/14 1234 97.6 F (36.4 C)     Temp Source 10/16/14 1234 Oral     SpO2 10/16/14 1234 98 %     Weight 10/16/14 1234 190 lb (86.183 kg)     Height --       Head Cir --      Peak Flow --      Pain Score 10/16/14 1235 9     Pain Loc --      Pain Edu? --      Excl. in GC? --      Constitutional: Alert and oriented. Well appearing and playing with his children in no distress Eyes: Conjunctivae are normal. Normal funduscopic exam ENT   Head: Normocephalic and atraumatic. No meningismus   Mouth/Throat: Mucous membranes are moist.  Gastrointestinal: Soft and non-tender in all quadrants. No distention. There is no CVA tenderness. Genitourinary: deferred Musculoskeletal: Nontender with normal range of motion in all extremities. No lower extremity tenderness nor edema. Neurologic:  Normal speech and language. No gross focal neurologic deficits are appreciated. Normal strength and sensation in all extremities Skin:  Skin is warm, dry and intact. No rash noted. Psychiatric: Mood and affect are normal. Patient exhibits appropriate insight and judgment.  ____________________________________________    LABS (pertinent positives/negatives)  Labs Reviewed - No data to display  ____________________________________________   EKG  None  ____________________________________________    RADIOLOGY I have personally reviewed any xrays that were ordered on this patient: None  ____________________________________________   PROCEDURES  Procedure(s) performed: none  Critical Care performed: none  ____________________________________________   INITIAL IMPRESSION / ASSESSMENT AND PLAN / ED COURSE  Pertinent labs & imaging results that were available during my care of the patient were reviewed by me and considered in my medical decision making (see chart  for details).  Patient well-appearing, benign neuro exam. Symptoms consistent with his usual headache syndrome. We will give IM Toradol and discharged with by mouth fioricet with pcp follow up  ----------------------------------------- 3:29 PM on  10/16/2014 -----------------------------------------  Patient reports after Toradol he had tingling on the right side of his body. He reports he's had this with a headache in the past. We will obtain CT head to evaluate the suspect this is related to migraine syndrome.  ----------------------------------------- 4:01 PM on 10/16/2014 -----------------------------------------  CT unremarkable. Patient reports tingling has resolved and his head feels better. Okay for discharge  ____________________________________________   FINAL CLINICAL IMPRESSION(S) / ED DIAGNOSES  Final diagnoses:  Acute nonintractable headache, unspecified headache type     Jene Every, MD 10/16/14 [phone]

## 2014-10-16 NOTE — ED Notes (Signed)
Developed right sided headache about 3 days ago  Pos nausea/vomitng  dizziness

## 2015-08-18 ENCOUNTER — Emergency Department: Payer: Self-pay

## 2015-08-18 ENCOUNTER — Emergency Department
Admission: EM | Admit: 2015-08-18 | Discharge: 2015-08-18 | Disposition: A | Discharge status: Home / Self Care | Payer: Self-pay | Attending: Emergency Medicine | Admitting: Emergency Medicine

## 2015-08-18 ENCOUNTER — Encounter: Payer: Self-pay | Admitting: Emergency Medicine

## 2015-08-18 DIAGNOSIS — R569 Unspecified convulsions: Secondary | ICD-10-CM

## 2015-08-18 DIAGNOSIS — R4182 Altered mental status, unspecified: Secondary | ICD-10-CM | POA: Insufficient documentation

## 2015-08-18 LAB — CBC WITH DIFFERENTIAL/PLATELET
BASOS ABS: 0.1 10*3/uL (ref 0–0.1)
BASOS PCT: 1 %
EOS PCT: 3 %
Eosinophils Absolute: 0.3 10*3/uL (ref 0–0.7)
HCT: 44.4 % (ref 40.0–52.0)
Hemoglobin: 15 g/dL (ref 13.0–18.0)
LYMPHS PCT: 35 %
Lymphs Abs: 3.4 10*3/uL (ref 1.0–3.6)
MCH: 30.7 pg (ref 26.0–34.0)
MCHC: 33.7 g/dL (ref 32.0–36.0)
MCV: 91.3 fL (ref 80.0–100.0)
Monocytes Absolute: 1 10*3/uL (ref 0.2–1.0)
Monocytes Relative: 10 %
NEUTROS ABS: 5 10*3/uL (ref 1.4–6.5)
Neutrophils Relative %: 51 %
PLATELETS: 220 10*3/uL (ref 150–440)
RBC: 4.86 MIL/uL (ref 4.40–5.90)
RDW: 13.8 % (ref 11.5–14.5)
WBC: 9.9 10*3/uL (ref 3.8–10.6)

## 2015-08-18 LAB — COMPREHENSIVE METABOLIC PANEL
ALBUMIN: 4.8 g/dL (ref 3.5–5.0)
ALK PHOS: 83 U/L (ref 38–126)
ALT: 26 U/L (ref 17–63)
AST: 26 U/L (ref 15–41)
Anion gap: 8 (ref 5–15)
BILIRUBIN TOTAL: 0.7 mg/dL (ref 0.3–1.2)
BUN: 23 mg/dL — AB (ref 6–20)
CALCIUM: 9.3 mg/dL (ref 8.9–10.3)
CO2: 25 mmol/L (ref 22–32)
CREATININE: 1.07 mg/dL (ref 0.61–1.24)
Chloride: 106 mmol/L (ref 101–111)
GFR calc Af Amer: >60 mL/min (ref >60)
GLUCOSE: 119 mg/dL — AB (ref 65–99)
POTASSIUM: 3 mmol/L — AB (ref 3.5–5.1)
Sodium: 139 mmol/L (ref 135–145)
TOTAL PROTEIN: 7.4 g/dL (ref 6.5–8.1)

## 2015-08-18 LAB — URINE DRUG SCREEN, QUALITATIVE (ARMC ONLY)
AMPHETAMINES, UR SCREEN: NOT DETECTED
BARBITURATES, UR SCREEN: NOT DETECTED
BENZODIAZEPINE, UR SCRN: NOT DETECTED
Cannabinoid 50 Ng, Ur ~~LOC~~: NOT DETECTED
Cocaine Metabolite,Ur ~~LOC~~: NOT DETECTED
MDMA (Ecstasy)Ur Screen: NOT DETECTED
METHADONE SCREEN, URINE: NOT DETECTED
OPIATE, UR SCREEN: NOT DETECTED
Phencyclidine (PCP) Ur S: NOT DETECTED
TRICYCLIC, UR SCREEN: NOT DETECTED

## 2015-08-18 LAB — TROPONIN I

## 2015-08-18 LAB — ETHANOL: Alcohol, Ethyl (B): <5 mg/dL (ref <5)

## 2015-08-18 MED ORDER — GADOBENATE DIMEGLUMINE 529 MG/ML IV SOLN
20.0000 mL | Freq: Once | INTRAVENOUS | Status: AC | PRN
  Administered 2015-08-18: 18 mL via INTRAVENOUS

## 2015-08-18 MED ORDER — LEVETIRACETAM 500 MG PO TABS: 500.0000 mg | ORAL_TABLET | Freq: Two times a day (BID) | ORAL | Status: DC

## 2015-08-18 NOTE — ED Notes (Signed)
Pt to MR at this time. Pt still remains mute and c/o blindness. Pt able to write "I'm confuse" and"Imno have no famili n remember" legibly on paper. Pt reports that he cannot hear at all on his left side and just some on his right side.

## 2015-08-18 NOTE — ED Notes (Signed)
Discussed discharge instructions, prescriptions, and follow-up care with patient. No questions or concerns at this time. Pt stable at discharge.  

## 2015-08-18 NOTE — ED Notes (Addendum)
Arriveed via POV, states had seizure at home and that turned completely blind by the time he arrived to ED. Pt not responding to verbal command but he was able to tell us that his ID is in his wallet. Pt immediately taken to CT for head.

## 2015-08-18 NOTE — ED Notes (Signed)
MD speaking to family at bedside 

## 2015-08-18 NOTE — ED Provider Notes (Signed)
Patient appeared to have some type of seizure event. Currently all of his symptoms have resolved. He is talking, ambulating and checking his phone. Patient states he is to take Keppra but hasn't in some time. I will prescribe Keppra for them to start back on and [facility]. Family is agreeable to the plan.  Emily FilbertJonathan E Williams, MD 08/18/15 [phone]

## 2015-08-18 NOTE — ED Provider Notes (Signed)
[facility] Emergency Department Provider Note   ____________________________________________  Time seen: Approximately 249 AM  I have reviewed the triage vital signs and the nursing notes.   HISTORY  Chief Complaint Seizures and Loss of Vision  Patient staring off and unable to answer questions at this time.  HPI Daniel Contreras is a 30 y.o. male who was found in the parking lot screaming that he needed help. The patient reported to the triage nurses that he had a seizure at home and that he went blind. The patient drove himself to the hospital although he is unsure how he arrived. The patient was brought back immediately as he was found stumbling in the bushes and once he was brought back he was unable to verbalize anything. The patient is Spanish-speaking but would not respond to problems in [place] nor in [place].I am unable to obtain a full history on the patient due to his lack of verbalization.   History reviewed. No pertinent past medical history.  There are no active problems to display for this patient.   History reviewed. No pertinent past surgical history.  Current Outpatient Rx  Name  Route  Sig  Dispense  Refill  . butalbital-acetaminophen-caffeine (FIORICET) 50-325-40 MG per tablet   Oral   Take 1-2 tablets by mouth every 6 (six) hours as needed for headache.   20 tablet   0     Allergies Review of patient's allergies indicates no known allergies.  History reviewed. No pertinent family history.  Social History Social History  Substance Use Topics  . Smoking status: Never Smoker   . Smokeless tobacco: None  . Alcohol Use: No    Review of Systems  Unable to assess due to patient's altered mental status and inability to answer questions appropriately at this time. 10-point ROS otherwise negative.  ____________________________________________   PHYSICAL EXAM:  VITAL SIGNS: ED Triage Vitals  Enc Vitals Group   BP 08/18/15 0247 140/94 mmHg     Pulse Rate 08/18/15 0247 61     Resp --      Temp 08/18/15 0247 97.3 F (36.3 C)     Temp Source 08/18/15 0247 Oral     SpO2 08/18/15 0247 100 %     Weight --      Height --      Head Cir --      Peak Flow --      Pain Score 08/18/15 0258 0     Pain Loc --      Pain Edu? --      Excl. in GC? --     Constitutional: Awake, staring off unable to determine orientation as patient not verbalizing. Eyes: Conjunctivae are normal. PERRL. EOMI. Head: Atraumatic. Nose: No congestion/rhinnorhea. Mouth/Throat: Mucous membranes are moist.  Oropharynx non-erythematous. Cardiovascular: Normal rate, regular rhythm. Grossly normal heart sounds.  Good peripheral circulation. Respiratory: Normal respiratory effort.  No retractions. Lungs CTAB. Gastrointestinal: Soft and nontender. No distention. Positive bowel sounds Musculoskeletal: No lower extremity tenderness nor edema.   Neurologic:  Patient not verbalizing at this time, unable to follow extraocular muscles at this time. Patient does blink to threat, patient attempts to grip fingers but unable to push or pull. Patient unable to fully follow commands at this time. Patient does respond to pain. Skin:  Skin is warm, dry and intact. Psychiatric: Mood and affect are normal.   ____________________________________________   LABS (all labs ordered are listed, but only abnormal results are displayed)  Labs Reviewed  COMPREHENSIVE METABOLIC PANEL - Abnormal; Notable for the following:    Potassium 3.0 (*)    Glucose, Bld 119 (*)    BUN 23 (*)    All other components within normal limits  ETHANOL  CBC WITH DIFFERENTIAL/PLATELET  TROPONIN I  URINE DRUG SCREEN, QUALITATIVE ([facility] ONLY)  CBG MONITORING, ED   ____________________________________________  EKG  ED ECG REPORT I, Rebecka Apley, the attending physician, personally viewed and interpreted this ECG.   Date: 08/18/2015  EKG Time: 234  Rate: 89   Rhythm: normal sinus rhythm  Axis: normal  Intervals:none  ST&T Change: none  ____________________________________________  RADIOLOGY  CT head: Subcentimeter hypodensity left pons could represent artifact, infarct or less likely demyelination, this would be better characterize on MRI of the brain including diffusion weighted sequences.  MRI brain: Stable posterior fossa arachnoid cyst otherwise negative MRI of the brain, pontine abnormality seen on today's CT was artifact. ____________________________________________   PROCEDURES  Procedure(s) performed: None  Critical Care performed: No  ____________________________________________   INITIAL IMPRESSION / ASSESSMENT AND PLAN / ED COURSE  Pertinent labs & imaging results that were available during my care of the patient were reviewed by me and considered in my medical decision making (see chart for details).  This is a 29 year old male who comes into the hospital today with altered mental status and nonresponsiveness. The patient initially was saying that he couldn't see and that he had a seizure but now he is not responding are saying much at all. The patient's eyes are open and he is breathing without difficulty. His CT scan is concerning for a possible infarct. We will consider performing an MRI once we receive the results of his blood work and urinalysis.  The patient's blood work is unremarkable. He did have a potassium of 3 but otherwise his urine drug screen, ethanol and the remainder of his labs are unremarkable. At this time I'm not sure what's causing the patient's symptoms. I go back into the room and he barely opens his eyes and he does not speak. The patient was able to write a note to the nurse earlier and he is able to mouth words but will not utter sounds. At this time I do not have a medical reason for his symptoms. I'll have psych evaluate the patient and discuss his history with his sisters to determine what may be  the cause of his symptoms. The patient's care was signed out to Dr. Mayford Knife who will reassess the patient and follow-up with psychiatric evaluation. ____________________________________________   FINAL CLINICAL IMPRESSION(S) / ED DIAGNOSES  Final diagnoses:  Altered mental status      NEW MEDICATIONS STARTED DURING THIS VISIT:  New Prescriptions   No medications on file     Note:  This document was prepared using Dragon voice recognition software and may include unintentional dictation errors.    Rebecka Apley, MD 08/18/15 [phone]

## 2015-08-18 NOTE — BH Assessment (Signed)
Assessment Note  Daniel Contreras is an 30 y.o. male who presents to the ER due to odd & bizarre behaviors. Per the report of the patient, he came to the ER due to having the feeling he was going to have a seizure. When he arrived to the ER, the patient was found in the bushes. Patient didn't remember the events, after he arrived to the ER.  While in there ER, the patient didn't talked, he would stare and had minimum movement. When Clinical research associatewriter talk with the patient, he was coherent an alert. He denied SI/HI and AV/H.  Discussed patient with ER MD (Dr. Mayford KnifeWilliams) and patient is able to discharge home when medically cleared.   Past Medical History: History reviewed. No pertinent past medical history.  History reviewed. No pertinent past surgical history.  Family History: History reviewed. No pertinent family history.  Social History:  reports that he has never smoked. He does not have any smokeless tobacco history on file. He reports that he does not drink alcohol. His drug history is not on file.  Additional Social History:  Alcohol / Drug Use Pain Medications: See PTA Prescriptions: See PTA Over the Counter: See PTA History of alcohol / drug use?: No history of alcohol / drug abuse Longest period of sobriety (when/how long): Reports of no use Negative Consequences of Use:  (Reports of no use) Withdrawal Symptoms:  (Reports of no use)  CIWA: CIWA-Ar BP: (!) 108/92 mmHg Pulse Rate: 61 COWS:    Allergies: No Known Allergies  Home Medications:  (Not in a hospital admission)  OB/GYN Status:  No LMP for male patient.  General Assessment Data [facility] Assessment: In system Is this a Tele or Face-to-Face Assessment?: Face-to-Face Is this an Initial Assessment or a Re-assessment for this encounter?: Initial Assessment Marital status: Married [place]: n/a Is patient pregnant?: No Pregnancy Status: No Living Arrangements: Spouse/significant other,  Children Can pt return to current living arrangement?: Yes Admission Status: Voluntary Is patient capable of signing voluntary admission?: No Referral Source: Self/Family/Friend Insurance type: n/a  Medical Screening Exam [facility] ONLY[facility] Medical Exam completed: Yes  Crisis Care Plan Living Arrangements: Spouse/significant other, Children Legal Guardian: Other: Name of Psychiatrist: Reports of none Name of Therapist: Reports of none  Education Status Is patient currently in school?: No Current Grade: n/a Highest grade of school patient has completed: Unknown Name of school: n/a Contact person: n/a  Risk to self with the past 6 months Suicidal Ideation: No Has patient been a risk to self within the past 6 months prior to admission? : No Suicidal Intent: No Has patient had any suicidal intent within the past 6 months prior to admission? : No Is patient at risk for suicide?: No Suicidal Plan?: No Has patient had any suicidal plan within the past 6 months prior to admission? : No Access to Means: No What has been your use of drugs/alcohol within the last 12 months?: Reports of none Previous Attempts/Gestures: No How many times?: 0 Other Self Harm Risks: Reports of none Triggers for Past Attempts: Unknown Intentional Self Injurious Behavior: None Family Suicide History: No Recent stressful life event(s): Other (Comment) (Reports of none) Persecutory voices/beliefs?: No Depression: No Depression Symptoms:  (Reports of none) Substance abuse history and/or treatment for substance abuse?: No Suicide prevention information given to non-admitted patients: Not applicable  Risk to Others within the past 6 months Homicidal Ideation: No Does patient have any lifetime risk of violence toward others  beyond the six months prior to admission? : No Thoughts of Harm to Others: No Current Homicidal Intent: No Current Homicidal Plan: No Access to Homicidal Means: No Identified Victim:  Reports of none History of harm to others?: No Assessment of Violence: None Noted Violent Behavior Description: Reports of none Does patient have access to weapons?: No Criminal Charges Pending?: No Does patient have a court date: No Is patient on probation?: No  Psychosis Hallucinations: None noted Delusions: None noted  Mental Status Report Appearance/Hygiene: In hospital gown, In scrubs, Unremarkable Eye Contact: Fair Motor Activity: Unremarkable, Freedom of movement Speech: Logical/coherent Level of Consciousness: Alert Mood: Pleasant, Euthymic Affect: Appropriate to circumstance Anxiety Level: None Thought Processes: Coherent, Relevant Judgement: Unimpaired Orientation: Person, Place, Time, Situation, Appropriate for developmental age Obsessive Compulsive Thoughts/Behaviors: None  Cognitive Functioning Concentration: Normal Memory: Recent Intact, Remote Intact IQ: Average Insight: Fair Impulse Control: Fair Appetite: Good Weight Loss: 0 Weight Gain: 0 Sleep: No Change Total Hours of Sleep: 8 Vegetative Symptoms: None  ADLScreening [facility] Assessment Services) Patient's cognitive ability adequate to safely complete daily activities?: Yes Patient able to express need for assistance with ADLs?: Yes Independently performs ADLs?: Yes (appropriate for developmental age)  Prior Inpatient Therapy Prior Inpatient Therapy: No Prior Therapy Dates: Reports of none Prior Therapy Facilty/Provider(s): Reports of none Reason for Treatment: Reports of none  Prior Outpatient Therapy Prior Outpatient Therapy: No Prior Therapy Dates: Reports of none Prior Therapy Facilty/Provider(s): Reports of none Reason for Treatment: Reports of none Does patient have an ACCT team?: No Does patient have Intensive In-House Services?  : No Does patient have [facility] services? : No Does patient have P4CC services?: No  ADL Screening (condition at time of admission) Patient's cognitive  ability adequate to safely complete daily activities?: Yes Is the patient deaf or have difficulty hearing?: No Does the patient have difficulty seeing, even when wearing glasses/contacts?: No Does the patient have difficulty concentrating, remembering, or making decisions?: No Patient able to express need for assistance with ADLs?: Yes Does the patient have difficulty dressing or bathing?: No Independently performs ADLs?: Yes (appropriate for developmental age) Does the patient have difficulty walking or climbing stairs?: No Weakness of Legs: None Weakness of Arms/Hands: None  Home Assistive Devices/Equipment Home Assistive Devices/Equipment: None  Therapy Consults (therapy consults require a physician order) PT Evaluation Needed: No OT Evalulation Needed: No SLP Evaluation Needed: No Abuse/Neglect Assessment (Assessment to be complete while patient is alone) Physical Abuse: Denies Verbal Abuse: Denies Sexual Abuse: Denies Exploitation of patient/patient's resources: Denies Self-Neglect: Denies Values / Beliefs Cultural Requests During Hospitalization: None Spiritual Requests During Hospitalization: None Consults Spiritual Care Consult Needed: No Social Work Consult Needed: No      Additional Information 1:1 In Past 12 Months?: No CIRT Risk: No Elopement Risk: No Does patient have medical clearance?: Yes  Child/Adolescent Assessment Running Away Risk: Denies (Patient is an adult)  Disposition:  Disposition Initial Assessment Completed for this Encounter: Yes Disposition of Patient: Outpatient treatment Type of outpatient treatment: Adult  On Site Evaluation by:   Reviewed with Physician:    Lilyan Gilford MS, LCAS, LPC, NCC, CCSI Therapeutic Triage Specialist 08/18/2015 10:30 AM

## 2015-08-18 NOTE — ED Notes (Addendum)
This RN (charge) called to parking lot to respond to a patient "yelling for help". Upon exiting the building, this RN observed patient standing in the middle of the flower bed in front of the it; standing in the middle of a holly bushing yelling and sobbing.Marland Kitchen.Marland Kitchen."Help me. Help me... I cant see". As RN approached patient, his vehicle was noted to be parked in the middle of the parking lot; running, lights on, and door open. [employer] to move vehicle and safely park. RN to patient's side. Patient advises that he had a seizure at home, however he drove himself here. Patient states, "I dont know how I got here. I am blind now. Please help me. I cant see"; continues to sob; (+) tears noted. Patient placed in a wheelchair and taken to ED 16; vomited x 1 en route to room. Patient initially verbal and responding to threat when eyes assessed (blinks when hand comes towards eyes). PIV immediately placed and labs obtained. STAT order for head CT placed and patient taken to CT at 0240 by this RN. MD aware of patient's arrival and presenting complaints.

## 2015-08-18 NOTE — Discharge Instructions (Signed)
Epilepsia °(Epilepsy) °La epilepsia es un trastorno en el que la persona tiene convulsiones repetidas con el paso del tiempo. Una convulsión es una liberación anormal de actividad eléctrica en el cerebro. Las convulsiones pueden provocar un cambio en la atención, la conducta o la capacidad de mantenerse despierto y alerta (estado mental alterado). Frecuentemente las convulsiones consisten en sacudidas incontrolables.  °La mayoría de las personas con epilepsia tiene una vida normal. Sin embargo, las personas con esta afección tienen un mayor riesgo de sufrir caídas, accidentes y lesiones. Por lo tanto, es importante comenzar el tratamiento de inmediato. °CAUSAS  °La epilepsia puede tener muchas causas posibles. Cualquier factor que perturbe el patrón normal de la actividad celular cerebral puede provocar convulsiones. Entre estos factores, se incluyen:  °· Traumatismo en la cabeza °· Traumatismo en el nacimiento. °· Fiebre alta en los niños. °· Ictus. °· Hemorragias en el cerebro o alrededor de este. °· Determinados medicamentos. °· Nivel de oxígeno bajo, prolongado, como lo que ocurre después de los esfuerzos de resucitación cardiopulmonar (RCP). °· Desarrollo anormal del cerebro. °· Ciertas enfermedades, como meningitis, encefalitis (infección cerebral), malaria y otras infecciones. °· Desequilibrio de las sustancias químicas que transportan señales a los nervios (neurotransmisores). °SIGNOS Y SÍNTOMAS  °Los síntomas de una convulsión pueden variar considerablemente de una persona a otra. Justo antes de una convulsión, puede tener una advertencia (aura) que indica que el ataque está a punto de ocurrir. Un aura puede incluir los siguientes síntomas: °· Miedo o ansiedad. °· Náuseas. °· Sentir que la habitación da vueltas (vértigo). °· Cambios en la visión, como ver destellos de luz o manchas. °Los síntomas más comunes durante un ataque son: °· Sensaciones anormales, como un olor anormal o un sabor amargo en la  boca. °· Entumecimiento corporal repentino y general. °· Convulsiones que implican sacudidas rítmicas de la cara, el brazo o la pierna en uno o ambos lados. °· Cambio repentino en la conciencia. °¨ Aparentar estar despierto, pero no responder. °¨ Aparentar estar dormido, pero que no puedan despertarlo. °· Hacer muecas, masticar, hacer chasquidos con los labios, babear, morderse la lengua o perder el control de la vejiga o los intestinos. °Después de una convulsión, puede ser que se sienta somnoliento durante un tiempo.  °DIAGNÓSTICO  °El médico le preguntará sobre sus síntomas y hará una historia clínica. Las descripciones de cualquier testigo de sus convulsiones serán muy útiles en el diagnóstico. Es necesario un examen físico, incluido un examen neurológico detallado. Se pueden realizar varios estudios, por ejemplo:  °· Electroencefalograma (EEG). Este es un estudio indoloro de las ondas del cerebro. En este estudio, se crea un diagrama de las ondas del cerebro. Un especialista puede interpretar estos diagramas. °· Resonancia magnética (RM) del cerebro. °· Tomografía computarizada (TC) del cerebro. °· Punción espinal (punción lumbar [PL]). °· Análisis de sangre para detectar signos de infección o bioquímica anormal de la sangre. °TRATAMIENTO  °La epilepsia no tiene cura, pero en general es tratable. Una vez que se diagnostica la epilepsia, es importante comenzar un tratamiento lo antes posible. En la mayoría de las personas con epilepsia, las convulsiones pueden controlarse con medicamentos. También se puede utilizar lo siguiente: °· Se puede emplear un marcapasos del cerebro (estimulador del nervio vago) en las personas con convulsiones que no logran controlarse adecuadamente con medicamentos. °· Cirugía del cerebro. °En algunas personas, la epilepsia desaparece con el tiempo. °INSTRUCCIONES PARA EL CUIDADO EN EL HOGAR  °· Siga las recomendaciones de su médico sobre la conducción de vehículos y   la seguridad en  las actividades normales. °· Descanse lo suficiente. La falta de sueño puede provocar convulsiones. °· Tome solo medicamentos de venta libre o recetados, según las indicaciones del médico. Tome los medicamentos recetados exactamente como se le indicó. °· Evite los desencadenantes conocidos de sus convulsiones. °· Lleve un diario de sus convulsiones. Registre lo que recuerda sobre una convulsión, especialmente un posible desencadenante. °· Asegúrese de que las personas con las que vive o trabaja sepan que es propenso a sufrir convulsiones. Ellas deben recibir instrucciones sobre cómo ayudarlo. En general, un testigo de una convulsión debe hacer lo siguiente: °¨ Colocar un almohadón debajo de su cabeza y cuerpo. °¨ Recostarlo sobre un lado. °¨ Evitar inmovilizarlo innecesariamente. °¨ No colocar nada dentro de su boca. °¨ Solicite asistencia médica de emergencia si hay preguntas sobre lo que ocurrió. °· Concurra a las consultas de control con su médico según las indicaciones. Es posible que necesite análisis de sangre normales para controlar los niveles de su medicamento. °SOLICITE ATENCIÓN MÉDICA SI:  °· Tiene signos de infección u otra enfermedad. Esto puede aumentar el riesgo de sufrir una convulsión. °· Parece que tiene convulsiones más frecuentes. °· Su patrón de convulsiones cambia. °SOLICITE ATENCIÓN MÉDICA DE INMEDIATO SI:  °· Tiene una convulsión que no se detiene después de unos momentos. °· Tiene una convulsión que le provoca dificultad para respirar. °· Tiene una convulsión que le ocasiona un dolor de cabeza muy intenso. °· Tiene una convulsión que lo deja sin la capacidad de poder hablar o usar una parte del cuerpo. °  °Esta información no tiene como fin reemplazar el consejo del médico. Asegúrese de hacerle al médico cualquier pregunta que tenga. °  °Document Released: 03/17/2005 Document Revised: 01/05/2013 °Elsevier Interactive Patient Education ©2016 [employer] ° °

## 2015-12-11 ENCOUNTER — Emergency Department
Admission: EM | Admit: 2015-12-11 | Discharge: 2015-12-12 | Disposition: A | Discharge status: Home / Self Care | Payer: Self-pay | Attending: Emergency Medicine | Admitting: Emergency Medicine

## 2015-12-11 ENCOUNTER — Encounter: Payer: Self-pay | Admitting: Emergency Medicine

## 2015-12-11 ENCOUNTER — Emergency Department: Payer: Self-pay

## 2015-12-11 DIAGNOSIS — R109 Unspecified abdominal pain: Secondary | ICD-10-CM | POA: Insufficient documentation

## 2015-12-11 DIAGNOSIS — R52 Pain, unspecified: Secondary | ICD-10-CM

## 2015-12-11 HISTORY — DX: Unspecified convulsions: R56.9

## 2015-12-11 LAB — CBC WITH DIFFERENTIAL/PLATELET
BASOS ABS: 0 10*3/uL (ref 0–0.1)
BASOS PCT: 1 %
EOS ABS: 0.1 10*3/uL (ref 0–0.7)
EOS PCT: 2 %
HCT: 45.9 % (ref 40.0–52.0)
Hemoglobin: 15.7 g/dL (ref 13.0–18.0)
LYMPHS PCT: 33 %
Lymphs Abs: 2.7 10*3/uL (ref 1.0–3.6)
MCH: 31.2 pg (ref 26.0–34.0)
MCHC: 34.2 g/dL (ref 32.0–36.0)
MCV: 91.1 fL (ref 80.0–100.0)
Monocytes Absolute: 0.6 10*3/uL (ref 0.2–1.0)
Monocytes Relative: 8 %
Neutro Abs: 4.7 10*3/uL (ref 1.4–6.5)
Neutrophils Relative %: 56 %
PLATELETS: 202 10*3/uL (ref 150–440)
RBC: 5.03 MIL/uL (ref 4.40–5.90)
RDW: 13.2 % (ref 11.5–14.5)
WBC: 8.3 10*3/uL (ref 3.8–10.6)

## 2015-12-11 LAB — CBC
HCT: 45.3 % (ref 40.0–52.0)
Hemoglobin: 15.9 g/dL (ref 13.0–18.0)
MCH: 31.6 pg (ref 26.0–34.0)
MCHC: 35.1 g/dL (ref 32.0–36.0)
MCV: 90 fL (ref 80.0–100.0)
PLATELETS: 201 10*3/uL (ref 150–440)
RBC: 5.04 MIL/uL (ref 4.40–5.90)
RDW: 13.1 % (ref 11.5–14.5)
WBC: 8.4 10*3/uL (ref 3.8–10.6)

## 2015-12-11 LAB — COMPREHENSIVE METABOLIC PANEL
ALK PHOS: 71 U/L (ref 38–126)
ALT: 24 U/L (ref 17–63)
AST: 22 U/L (ref 15–41)
Albumin: 4.8 g/dL (ref 3.5–5.0)
Anion gap: 6 (ref 5–15)
BUN: 17 mg/dL (ref 6–20)
CALCIUM: 9.2 mg/dL (ref 8.9–10.3)
CO2: 28 mmol/L (ref 22–32)
CREATININE: 1.05 mg/dL (ref 0.61–1.24)
Chloride: 104 mmol/L (ref 101–111)
Glucose, Bld: 137 mg/dL — ABNORMAL HIGH (ref 65–99)
Potassium: 3.8 mmol/L (ref 3.5–5.1)
SODIUM: 138 mmol/L (ref 135–145)
Total Bilirubin: 1.2 mg/dL (ref 0.3–1.2)
Total Protein: 7.8 g/dL (ref 6.5–8.1)

## 2015-12-11 LAB — LIPASE, BLOOD: Lipase: 25 U/L (ref 11–51)

## 2015-12-11 MED ORDER — ONDANSETRON HCL 4 MG/2ML IJ SOLN
4.0000 mg | Freq: Once | INTRAMUSCULAR | Status: AC
  Administered 2015-12-11: 4 mg via INTRAVENOUS
  Filled 2015-12-11: qty 2

## 2015-12-11 MED ORDER — HYDROMORPHONE HCL 1 MG/ML IJ SOLN
0.5000 mg | Freq: Once | INTRAMUSCULAR | Status: AC
  Administered 2015-12-11: 0.5 mg via INTRAVENOUS
  Filled 2015-12-11: qty 1

## 2015-12-11 NOTE — ED Notes (Signed)
Pt transported to CT via stretcher.  

## 2015-12-11 NOTE — ED Notes (Signed)
Pt stated he wanted an interpreter, request placed.

## 2015-12-11 NOTE — ED Notes (Signed)
Pt states about 2 hours ago, RLQ abdominal pain began. States sharp. Also c/o chest pain located centrally. Pt denies urinary symptoms, fever, diarrhea, vomiting. Pt states some nausea. Pt tried eating a quasadilla about 30 minutes ago to settle stomach, states pain increased with eating. Pt states that he has all abdomina organs, non removed. Pt is squirming in bed, appears in pain.

## 2015-12-11 NOTE — ED Triage Notes (Signed)
Pt presents to ED with c/o sudden RLQ abdominal pain for the past 2 hours, with nausea. Pt denies vomiting or diarrhea, denies urinary symptoms. Pt appears in discomfort.

## 2015-12-11 NOTE — ED Provider Notes (Signed)
[facility] Emergency Department Provider Note   ____________________________________________   First MD Initiated Contact with Patient 12/11/15 2209     (approximate)  I have reviewed the triage vital signs and the nursing notes.   HISTORY  Chief Complaint Abdominal Pain    HPI Stephfon Taeshawn Helfman is a 30 y.o. male who reports he had a big lunch and then had a short TM and then very rapidly developed abdominal pain which was severe and localized mostly to the right lower quadrant. He reports the bumps in the road made the pain worse. He later then said he had an episode 3 years ago where he had to have "surgery" and have the stool pulled out of his colon. After that happened he had hemorrhoids. He now feels like he has something between his rectum and his testicles. Patient could not tell me that he had no appetite or was nauseated and vomiting.   Past Medical History:  Diagnosis Date  . Seizures (HCC)     There are no active problems to display for this patient.   History reviewed. No pertinent surgical history.  Prior to Admission medications   Medication Sig Start Date End Date Taking? Authorizing Provider  levETIRAcetam (KEPPRA) 500 MG tablet Take 1 tablet (500 mg total) by mouth 2 (two) times daily. 08/18/15   Emily Filbert, MD    Allergies Review of patient's allergies indicates no known allergies.  No family history on file.  Social History Social History  Substance Use Topics  . Smoking status: Never Smoker  . Smokeless tobacco: Never Used  . Alcohol use No    Review of Systems Constitutional: No fever/chills Eyes: No visual changes. ENT: No sore throat. Cardiovascular: Denies chest pain. Respiratory: Denies shortness of breath. Gastrointestinal:See history of present illness Genitourinary: Negative for dysuria. Musculoskeletal: Negative for back pain. Skin: Negative for rash.  10-point ROS otherwise  negative.  ____________________________________________   PHYSICAL EXAM:  VITAL SIGNS: ED Triage Vitals  Enc Vitals Group     BP 12/11/15 2158 106/69     Pulse Rate 12/11/15 2158 68     Resp 12/11/15 2158 18     Temp 12/11/15 2158 98.6 F (37 C)     Temp Source 12/11/15 2158 Oral     SpO2 12/11/15 2158 98 %     Weight 12/11/15 2158 146 lb (66.2 kg)     Height 12/11/15 2158 5\' 3"  (1.6 m)     Head Circumference --      Peak Flow --      Pain Score 12/11/15 2159 10     Pain Loc --      Pain Edu? --      Excl. in GC? --     Constitutional: Alert and oriented. Well appearing and in acute distress. Eyes: Conjunctivae are normal. PERRL. EOMI. Head: Atraumatic. Nose: No congestion/rhinnorhea. Mouth/Throat: Mucous membranes are moist.  Oropharynx non-erythematous. Neck: No stridor Cardiovascular: Normal rate, regular rhythm. Grossly normal heart sounds.  Good peripheral circulation. Respiratory: Normal respiratory effort.  No retractions. Lungs CTAB. Gastrointestinal: Soft and Markedly tender even to light palpation. No distention. No abdominal bruits. No CVA normal bowel sounds tenderness. Rectal: Somewhat tender there is a possibility he may have some small internal hemorrhoids. There are no external hemorrhoids. There is no bleeding. There is no stool in the rectum. This is also shown on the CT. Musculoskeletal: No lower extremity tenderness nor edema.  No joint effusions. Neurologic:  Normal  speech and language. No gross focal neurologic deficits are appreciated. . Skin:  Skin is warm, dry and intact. No rash noted. Psychiatric: Mood and affect are normal. Speech and behavior are normal.  ____________________________________________   LABS (all labs ordered are listed, but only abnormal results are displayed)  Labs Reviewed  COMPREHENSIVE METABOLIC PANEL - Abnormal; Notable for the following:       Result Value   Glucose, Bld 137 (*)    All other components within normal  limits  LIPASE, BLOOD  CBC  CBC WITH DIFFERENTIAL/PLATELET  URINALYSIS COMPLETEWITH MICROSCOPIC ([facility] ONLY)   ____________________________________________  EKG   ____________________________________________  RADIOLOGY  CT was read by the radiologist is essentially not showing any cause for the abdominal pain. I reviewed the CT there does appear to be a lot of stool present. ____________________________________________   PROCEDURES  Procedure(s) performed:  Procedures  Critical Care performed:  ____________________________________________   INITIAL IMPRESSION / ASSESSMENT AND PLAN / ED COURSE  Pertinent labs & imaging results that were available during my care of the patient were reviewed by me and considered in my medical decision making (see chart for details).  I discussed the CT of the lab results with the patient. We will attempt an enema or 2. Dr. Manson PasseyBrown will follow up the patient to see what happens  Clinical Course     ____________________________________________   FINAL CLINICAL IMPRESSION(S) / ED DIAGNOSES  Final diagnoses:  Pain      NEW MEDICATIONS STARTED DURING THIS VISIT:  New Prescriptions   No medications on file     Note:  This document was prepared using Dragon voice recognition software and may include unintentional dictation errors.    Arnaldo NatalPaul F Kenyatte Chatmon, MD 12/11/15 [phone]

## 2015-12-11 NOTE — ED Notes (Addendum)
Pt stated he was able to go a little bit, states he feels better now. Pt appears more comfortable now.

## 2015-12-11 NOTE — ED Notes (Signed)
Pt returned from CT via stretcher.

## 2015-12-11 NOTE — ED Notes (Signed)
Called lab to add on differential to CBC. Lab stated they could run it.

## 2016-04-15 ENCOUNTER — Encounter: Payer: Self-pay | Admitting: Emergency Medicine

## 2016-04-15 ENCOUNTER — Emergency Department
Admission: EM | Admit: 2016-04-15 | Discharge: 2016-04-15 | Disposition: A | Discharge status: Home / Self Care | Payer: Self-pay | Attending: Emergency Medicine | Admitting: Emergency Medicine

## 2016-04-15 DIAGNOSIS — J101 Influenza due to other identified influenza virus with other respiratory manifestations: Secondary | ICD-10-CM

## 2016-04-15 DIAGNOSIS — J09X2 Influenza due to identified novel influenza A virus with other respiratory manifestations: Secondary | ICD-10-CM | POA: Insufficient documentation

## 2016-04-15 LAB — INFLUENZA PANEL BY PCR (TYPE A & B)
Influenza A By PCR: POSITIVE — AB
Influenza B By PCR: NEGATIVE

## 2016-04-15 LAB — POCT RAPID STREP A: Streptococcus, Group A Screen (Direct): NEGATIVE

## 2016-04-15 MED ORDER — ACETAMINOPHEN 500 MG PO TABS
ORAL_TABLET | ORAL | Status: AC
  Filled 2016-04-15: qty 2

## 2016-04-15 MED ORDER — ACETAMINOPHEN 500 MG PO TABS
1000.0000 mg | ORAL_TABLET | Freq: Once | ORAL | Status: AC | PRN
  Administered 2016-04-15: 1000 mg via ORAL

## 2016-04-15 MED ORDER — LOPERAMIDE HCL 2 MG PO TABS: 2.0000 mg | ORAL_TABLET | Freq: Four times a day (QID) | ORAL | 0 refills | Status: DC | PRN

## 2016-04-15 MED ORDER — ONDANSETRON 4 MG PO TBDP: 4.0000 mg | ORAL_TABLET | Freq: Three times a day (TID) | ORAL | 0 refills | Status: DC | PRN

## 2016-04-15 NOTE — ED Provider Notes (Signed)
[facility] Emergency Department Provider Note  ____________________________________________  Time seen: Approximately 5:06 PM  I have reviewed the triage vital signs and the nursing notes.   HISTORY  Chief Complaint Chills; Sore Throat; and Generalized Body Aches    HPI Daniel Contreras is a 31 y.o. male who presents emergency department for a 5 day history of body aches, chills, phalanges, nasal congestion, sore throat, nausea, loose stools. Patient states that symptoms began rather rapidly. Symptoms have been ongoing 5 days. He has been taking Tylenol but no other medications at home. Patient reports that Tylenol does not completely alleviate symptoms. Initially, patient thought symptoms would go away but they've lingered and that is what patient presents emergency department 5 days after onset.   Past Medical History:  Diagnosis Date  . Seizures (HCC)     There are no active problems to display for this patient.   History reviewed. No pertinent surgical history.  Prior to Admission medications   Medication Sig Start Date End Date Taking? Authorizing Provider  levETIRAcetam (KEPPRA) 500 MG tablet Take 1 tablet (500 mg total) by mouth 2 (two) times daily. 08/18/15   Emily Filbert, MD  loperamide (IMODIUM A-D) 2 MG tablet Take 1 tablet (2 mg total) by mouth 4 (four) times daily as needed for diarrhea or loose stools. 04/15/16   Delorise Royals Cuthriell, PA-C  ondansetron (ZOFRAN-ODT) 4 MG disintegrating tablet Take 1 tablet (4 mg total) by mouth every 8 (eight) hours as needed for nausea or vomiting. 04/15/16   Delorise Royals Cuthriell, PA-C    Allergies Patient has no known allergies.  No family history on file.  Social History Social History  Substance Use Topics  . Smoking status: Never Smoker  . Smokeless tobacco: Never Used  . Alcohol use No     Review of Systems  Constitutional: No fever/chills Eyes: No visual changes. No  discharge ENT: Positive for nasal congestion and sore throat. Cardiovascular: no chest pain. Respiratory: no cough. No SOB. Gastrointestinal: No abdominal pain.  Positive for nausea but no vomiting.  No diarrhea but looser stools..  No constipation. Musculoskeletal: Positive for generalized myalgias. Skin: Negative for rash, abrasions, lacerations, ecchymosis. Neurological: Negative for headaches, focal weakness or numbness. 10-point ROS otherwise negative.  ____________________________________________   PHYSICAL EXAM:  VITAL SIGNS: ED Triage Vitals  Enc Vitals Group     BP 04/15/16 1532 128/67     Pulse Rate 04/15/16 1532 (!) 106     Resp 04/15/16 1532 20     Temp 04/15/16 1532 (!) 102.5 F (39.2 C)     Temp Source 04/15/16 1532 Oral     SpO2 04/15/16 1532 100 %     Weight 04/15/16 1532 156 lb (70.8 kg)     Height 04/15/16 1532 5\' 3"  (1.6 m)     Head Circumference --      Peak Flow --      Pain Score 04/15/16 1540 7     Pain Loc --      Pain Edu? --      Excl. in GC? --      Constitutional: Alert and oriented. Well appearing and in no acute distress. Eyes: Conjunctivae are normal. PERRL. EOMI. Head: Atraumatic. ENT:      Ears: EACs and TMs are unremarkable bilaterally.      Nose: Moderate clear congestion/rhinnorhea.      Mouth/Throat: Mucous membranes are moist. Oropharynx is mildly erythematous but nonedematous. Tonsils are mildly erythematous and mildly  edematous. No exudates. Neck: No stridor. Neck is supple with full range of motion Hematological/Lymphatic/Immunilogical: Diffuse, mobile, nontender anterior cervical lymphadenopathy. Cardiovascular: Normal rate, regular rhythm. Normal S1 and S2.  Good peripheral circulation. Respiratory: Normal respiratory effort without tachypnea or retractions. Lungs CTAB. Good air entry to the bases with no decreased or absent breath sounds. Gastrointestinal: Bowel sounds 4 quadrants. Soft and nontender to palpation. No guarding  or rigidity. No palpable masses. No distention Musculoskeletal: Full range of motion to all extremities. No gross deformities appreciated. Neurologic:  Normal speech and language. No gross focal neurologic deficits are appreciated.  Skin:  Skin is warm, dry and intact. No rash noted. Psychiatric: Mood and affect are normal. Speech and behavior are normal. Patient exhibits appropriate insight and judgement.   ____________________________________________   LABS (all labs ordered are listed, but only abnormal results are displayed)  Labs Reviewed  INFLUENZA PANEL BY PCR (TYPE A & B) - Abnormal; Notable for the following:       Result Value   Influenza A By PCR POSITIVE (*)    All other components within normal limits  POCT RAPID STREP A   ____________________________________________  EKG   ____________________________________________  RADIOLOGY   No results found.  ____________________________________________    PROCEDURES  Procedure(s) performed:    Procedures    Medications  acetaminophen (TYLENOL) 500 MG tablet (not administered)  acetaminophen (TYLENOL) tablet 1,000 mg (1,000 mg Oral Given 04/15/16 1544)     ____________________________________________   INITIAL IMPRESSION / ASSESSMENT AND PLAN / ED COURSE  Pertinent labs & imaging results that were available during my care of the patient were reviewed by me and considered in my medical decision making (see chart for details).  Review of the [place] CSRS was performed in accordance of the [facility] prior to dispensing any controlled drugs.  Clinical Course     Patient's diagnosis is consistent with Influenza a. Patient has had 5 days of sudden onset symptoms. Positive influenza testing and emergency department. Patient is outside of one no for Tamiflu. He is to continue Tylenol and Motrin at home. Patient is given prescriptions for symptom control medication of Zofran and loperamide.Marland Kitchen. He will follow-up with  primary care as needed. Patient is given ED precautions to return to the ED for any worsening or new symptoms.     ____________________________________________  FINAL CLINICAL IMPRESSION(S) / ED DIAGNOSES  Final diagnoses:  Influenza A      NEW MEDICATIONS STARTED DURING THIS VISIT:  Discharge Medication List as of 04/15/2016  4:57 PM    START taking these medications   Details  loperamide (IMODIUM A-D) 2 MG tablet Take 1 tablet (2 mg total) by mouth 4 (four) times daily as needed for diarrhea or loose stools., Starting Tue 04/15/2016, Print    ondansetron (ZOFRAN-ODT) 4 MG disintegrating tablet Take 1 tablet (4 mg total) by mouth every 8 (eight) hours as needed for nausea or vomiting., Starting Tue 04/15/2016, Print            This chart was dictated using voice recognition software/Dragon. Despite best efforts to proofread, errors can occur which can change the meaning. Any change was purely unintentional.    Racheal PatchesJonathan D Cuthriell, PA-C 04/15/16 1710    Myrna Blazeravid Matthew Schaevitz, MD 04/16/16 [phone]

## 2016-04-15 NOTE — ED Notes (Signed)
Pt c/o body aches, headache, sore throat, and fevers since this weekend. Pt also c/o n/v/d. Pt A&OX4.

## 2016-04-15 NOTE — ED Triage Notes (Signed)
Pt reports fever and body aches x5 days.

## 2016-04-15 NOTE — ED Triage Notes (Signed)
Per interpreter pt with chills, body aches for five days.

## 2016-04-15 NOTE — ED Notes (Signed)
D/c done through interpreter at bedside

## 2016-10-13 ENCOUNTER — Emergency Department
Admission: EM | Admit: 2016-10-13 | Discharge: 2016-10-13 | Disposition: A | Discharge status: Home / Self Care | Payer: Self-pay | Attending: Emergency Medicine | Admitting: Emergency Medicine

## 2016-10-13 ENCOUNTER — Emergency Department: Payer: Self-pay

## 2016-10-13 ENCOUNTER — Encounter: Payer: Self-pay | Admitting: Emergency Medicine

## 2016-10-13 DIAGNOSIS — Y929 Unspecified place or not applicable: Secondary | ICD-10-CM | POA: Insufficient documentation

## 2016-10-13 DIAGNOSIS — Y998 Other external cause status: Secondary | ICD-10-CM | POA: Insufficient documentation

## 2016-10-13 DIAGNOSIS — W19XXXA Unspecified fall, initial encounter: Secondary | ICD-10-CM | POA: Insufficient documentation

## 2016-10-13 DIAGNOSIS — Y939 Activity, unspecified: Secondary | ICD-10-CM | POA: Insufficient documentation

## 2016-10-13 DIAGNOSIS — H0015 Chalazion left lower eyelid: Secondary | ICD-10-CM | POA: Insufficient documentation

## 2016-10-13 DIAGNOSIS — R220 Localized swelling, mass and lump, head: Secondary | ICD-10-CM | POA: Insufficient documentation

## 2016-10-13 DIAGNOSIS — S60221A Contusion of right hand, initial encounter: Secondary | ICD-10-CM | POA: Insufficient documentation

## 2016-10-13 MED ORDER — GENTAMICIN SULFATE 0.3 % OP SOLN: 1.0000 [drp] | OPHTHALMIC | 0 refills | Status: DC

## 2016-10-13 MED ORDER — NAPROXEN 500 MG PO TABS: 500.0000 mg | ORAL_TABLET | Freq: Two times a day (BID) | ORAL | Status: DC

## 2016-10-13 NOTE — ED Provider Notes (Signed)
[facility] Emergency Department Provider Note   ____________________________________________   First MD Initiated Contact with Patient 10/13/16 1228     (approximate)  I have reviewed the triage vital signs and the nursing notes.   HISTORY  Chief Complaint Hand Pain and Facial Swelling    HPI Daniel Contreras is a 31 y.o. male patient complaining of a papular lesion right lower eyelid for 1 week. Vision also complaining of right hand pain secondary to a fall which happened 2 days ago. Patient stated pain increases in his hand when making a fist. Patient denies any vision changes with his eye complaint.Patient rates his hand pain as a 7/10. Patient described a pain as "achy". No palliative measures for either complaints.   Past Medical History:  Diagnosis Date  . Seizures (HCC)     There are no active problems to display for this patient.   History reviewed. No pertinent surgical history.  Prior to Admission medications   Medication Sig Start Date End Date Taking? Authorizing Provider  gentamicin (GARAMYCIN) 0.3 % ophthalmic solution Place 1 drop into the left eye every 4 (four) hours. 10/13/16   Joni Reining, PA-C  levETIRAcetam (KEPPRA) 500 MG tablet Take 1 tablet (500 mg total) by mouth 2 (two) times daily. 08/18/15   Emily Filbert, MD  loperamide (IMODIUM A-D) 2 MG tablet Take 1 tablet (2 mg total) by mouth 4 (four) times daily as needed for diarrhea or loose stools. 04/15/16   Cuthriell, Delorise Royals, PA-C  naproxen (NAPROSYN) 500 MG tablet Take 1 tablet (500 mg total) by mouth 2 (two) times daily with a meal. 10/13/16   Joni Reining, PA-C  ondansetron (ZOFRAN-ODT) 4 MG disintegrating tablet Take 1 tablet (4 mg total) by mouth every 8 (eight) hours as needed for nausea or vomiting. 04/15/16   Cuthriell, Delorise Royals, PA-C    Allergies Patient has no known allergies.  No family history on file.  Social History Social History    Substance Use Topics  . Smoking status: Never Smoker  . Smokeless tobacco: Never Used  . Alcohol use No    Review of Systems  Constitutional: No fever/chills Eyes: No visual changes. ENT: No sore throat. Left eye lesion Cardiovascular: Denies chest pain. Respiratory: Denies shortness of breath. Gastrointestinal: No abdominal pain.  No nausea, no vomiting.  No diarrhea.  No constipation. Genitourinary: Negative for dysuria. Musculoskeletal: Right hand pain.  Skin: Negative for rash. Neurological: Negative for headaches, focal weakness or numbness.   ____________________________________________   PHYSICAL EXAM:  VITAL SIGNS: ED Triage Vitals [10/13/16 1209]  Enc Vitals Group     BP 123/71     Pulse Rate 82     Resp 16     Temp 98.2 F (36.8 C)     Temp Source Oral     SpO2 99 %     Weight 146 lb (66.2 kg)     Height 5\' 3"  (1.6 m)     Head Circumference      Peak Flow      Pain Score 7     Pain Loc      Pain Edu?      Excl. in GC?     Constitutional: Alert and oriented. Well appearing and in no acute distress. Eyes: Conjunctivae are normal. PERRL. EOMI. Papular lesion left lower lateral eyelid  Cardiovascular: Normal rate, regular rhythm. Grossly normal heart sounds.  Good peripheral circulation. Respiratory: Normal respiratory effort.  No retractions.  Lungs CTAB. Gastrointestinal: Soft and nontender. No distention. No abdominal bruits. No CVA tenderness. Musculoskeletal: No obvious deformity/edema/erythema to the right hand. Patient decreased range of motion with flexion of the phalanges.  Neurologic:  Normal speech and language. No gross focal neurologic deficits are appreciated. No gait instability. Skin:  Skin is warm, dry and intact. No rash noted. Psychiatric: Mood and affect are normal. Speech and behavior are normal.  ____________________________________________   LABS (all labs ordered are listed, but only abnormal results are displayed)  Labs  Reviewed - No data to display ____________________________________________  EKG   ____________________________________________  RADIOLOGY  Dg Hand Complete Right  Result Date: 10/13/2016 CLINICAL DATA:  Fall, right hand pain/swelling EXAM: RIGHT HAND - COMPLETE 3+ VIEW COMPARISON:  None. FINDINGS: No fracture or dislocation is seen. The joint spaces are preserved. The visualized soft tissues are unremarkable. IMPRESSION: Negative. Electronically Signed   By: Charline BillsSriyesh  Krishnan M.D.   On: 10/13/2016 12:48    ___No acute findings x-ray of the right hand. _________________________________________   PROCEDURES  Procedure(s) performed: None  Procedures  Critical Care performed: No  ____________________________________________   INITIAL IMPRESSION / ASSESSMENT AND PLAN / ED COURSE  Pertinent labs & imaging results that were available during my care of the patient were reviewed by me and considered in my medical decision making (see chart for details).  Please and right lower eyelid.   chalazion left lower left eyelid and right hand contusion. Discussed x-ray finding with patient. Patient given discharge Instructions for complaints. Patient advised follow-up with [facility] if no improvement 2 weeks from the lesion of his left eye.   ____________________________________________   FINAL CLINICAL IMPRESSION(S) / ED DIAGNOSES  Final diagnoses:  Chalazion of left lower eyelid  Contusion of right hand, initial encounter      NEW MEDICATIONS STARTED DURING THIS VISIT:  New Prescriptions   GENTAMICIN (GARAMYCIN) 0.3 % OPHTHALMIC SOLUTION    Place 1 drop into the left eye every 4 (four) hours.   NAPROXEN (NAPROSYN) 500 MG TABLET    Take 1 tablet (500 mg total) by mouth 2 (two) times daily with a meal.     Note:  This document was prepared using Dragon voice recognition software and may include unintentional dictation errors.    Joni ReiningSmith, Catia Todorov K, PA-C 10/13/16  1306    Joni ReiningSmith, Onofre Gains K, PA-C 10/13/16 1307    Nita SickleVeronese, [place], MD 10/14/16 [phone]

## 2016-10-13 NOTE — ED Triage Notes (Signed)
States he fell on sat  Having pain with some swelling to right hand   Also noticed a small stye area to left eye about 1 week ago

## 2017-03-25 ENCOUNTER — Other Ambulatory Visit: Payer: Self-pay

## 2017-03-25 ENCOUNTER — Encounter: Payer: Self-pay | Admitting: Emergency Medicine

## 2017-03-25 ENCOUNTER — Emergency Department
Admission: EM | Admit: 2017-03-25 | Discharge: 2017-03-25 | Disposition: A | Discharge status: Home / Self Care | Payer: Self-pay | Attending: Emergency Medicine | Admitting: Emergency Medicine

## 2017-03-25 DIAGNOSIS — Z79899 Other long term (current) drug therapy: Secondary | ICD-10-CM | POA: Insufficient documentation

## 2017-03-25 DIAGNOSIS — J029 Acute pharyngitis, unspecified: Secondary | ICD-10-CM

## 2017-03-25 DIAGNOSIS — J020 Streptococcal pharyngitis: Secondary | ICD-10-CM | POA: Insufficient documentation

## 2017-03-25 LAB — GROUP A STREP BY PCR: Group A Strep by PCR: DETECTED — AB

## 2017-03-25 MED ORDER — MAGIC MOUTHWASH: 5.0000 mL | Freq: Three times a day (TID) | ORAL | 0 refills | Status: DC | PRN

## 2017-03-25 MED ORDER — PENICILLIN G BENZATHINE 1200000 UNIT/2ML IM SUSP
1.2000 10*6.[IU] | Freq: Once | INTRAMUSCULAR | Status: AC
Start: 2017-03-25 — End: 2017-03-25
  Administered 2017-03-25: 1.2 10*6.[IU] via INTRAMUSCULAR
  Filled 2017-03-25: qty 2

## 2017-03-25 MED ORDER — MAGIC MOUTHWASH
10.0000 mL | Freq: Once | ORAL | Status: AC
  Administered 2017-03-25: 10 mL via ORAL
  Filled 2017-03-25: qty 10

## 2017-03-25 MED ORDER — DEXAMETHASONE SODIUM PHOSPHATE 10 MG/ML IJ SOLN
10.0000 mg | Freq: Once | INTRAMUSCULAR | Status: AC
  Administered 2017-03-25: 10 mg via INTRAMUSCULAR
  Filled 2017-03-25: qty 1

## 2017-03-25 NOTE — ED Provider Notes (Signed)
[facility] Emergency Department Provider Note   ____________________________________________   First MD Initiated Contact with Patient 03/25/17 [phone]     (approximate)  I have reviewed the triage vital signs and the nursing notes.   HISTORY  Chief Complaint Sore Throat    HPI Daniel Contreras is a 31 y.o. male who presents to the ED from home with a chief complaint of sore throat.  Patient had recent cold symptoms, today with sore throat.  Complains of pain on swallowing.  Tolerating secretions.  Denies associated fever, chills, chest pain, shortness of breath, abdominal pain, nausea or vomiting.  Denies recent travel or trauma.   Past Medical History:  Diagnosis Date  . Seizures (HCC)     There are no active problems to display for this patient.   History reviewed. No pertinent surgical history.  Prior to Admission medications   Medication Sig Start Date End Date Taking? Authorizing Provider  gentamicin (GARAMYCIN) 0.3 % ophthalmic solution Place 1 drop into the left eye every 4 (four) hours. 10/13/16   Joni ReiningSmith, Ronald K, PA-C  levETIRAcetam (KEPPRA) 500 MG tablet Take 1 tablet (500 mg total) by mouth 2 (two) times daily. 08/18/15   Emily FilbertWilliams, Jonathan E, MD  loperamide (IMODIUM A-D) 2 MG tablet Take 1 tablet (2 mg total) by mouth 4 (four) times daily as needed for diarrhea or loose stools. 04/15/16   Cuthriell, Delorise RoyalsJonathan D, PA-C  magic mouthwash SOLN Take 5 mLs by mouth 3 (three) times daily as needed for mouth pain. 03/25/17   Irean HongSung, Jade J, MD  naproxen (NAPROSYN) 500 MG tablet Take 1 tablet (500 mg total) by mouth 2 (two) times daily with a meal. 10/13/16   Joni ReiningSmith, Ronald K, PA-C  ondansetron (ZOFRAN-ODT) 4 MG disintegrating tablet Take 1 tablet (4 mg total) by mouth every 8 (eight) hours as needed for nausea or vomiting. 04/15/16   Cuthriell, Delorise RoyalsJonathan D, PA-C    Allergies Patient has no known allergies.  No family history on file.  Social  History Social History   Tobacco Use  . Smoking status: Never Smoker  . Smokeless tobacco: Never Used  Substance Use Topics  . Alcohol use: No  . Drug use: Not on file    Review of Systems   Constitutional: No fever/chills. Eyes: No visual changes. ENT: Positive for sore throat. Cardiovascular: Denies chest pain. Respiratory: Denies shortness of breath. Gastrointestinal: No abdominal pain.  No nausea, no vomiting.  No diarrhea.  No constipation. Genitourinary: Negative for dysuria. Musculoskeletal: Negative for back pain. Skin: Negative for rash. Neurological: Negative for headaches, focal weakness or numbness.   ____________________________________________   PHYSICAL EXAM:  VITAL SIGNS: ED Triage Vitals  Enc Vitals Group     BP 03/25/17 0227 112/77     Pulse Rate 03/25/17 0227 67     Resp 03/25/17 0227 20     Temp 03/25/17 0227 98.2 F (36.8 C)     Temp Source 03/25/17 0227 Oral     SpO2 03/25/17 0227 99 %     Weight 03/25/17 0226 156 lb (70.8 kg)     Height 03/25/17 0226 5\' 6"  (1.676 m)     Head Circumference --      Peak Flow --      Pain Score 03/25/17 0226 7     Pain Loc --      Pain Edu? --      Excl. in GC? --     Constitutional: Alert and oriented. Well  appearing and in no acute distress. Eyes: Conjunctivae are normal. PERRL. EOMI. Head: Atraumatic. Nose: No congestion/rhinnorhea. Mouth/Throat: Mucous membranes are moist.  Oropharynx erythematous with mild bilateral tonsillar swelling.  No tonsillar exudates or peritonsillar abscess.  There is no hoarse or muffled voice.  There is no drooling. Neck: No stridor.  Supple neck without meningismus. Hematological/Lymphatic/Immunilogical:Shotty anterior cervical lymphadenopathy. Cardiovascular: Normal rate, regular rhythm. Grossly normal heart sounds.  Good peripheral circulation. Respiratory: Normal respiratory effort.  No retractions. Lungs CTAB. Gastrointestinal: Soft and nontender. No distention. No  abdominal bruits. No CVA tenderness. Musculoskeletal: No lower extremity tenderness nor edema.  No joint effusions. Neurologic:  Normal speech and language. No gross focal neurologic deficits are appreciated. No gait instability. Skin:  Skin is warm, dry and intact. No rash noted.  No petechiae. Psychiatric: Mood and affect are normal. Speech and behavior are normal.  ____________________________________________   LABS (all labs ordered are listed, but only abnormal results are displayed)  Labs Reviewed  GROUP A STREP BY PCR - Abnormal; Notable for the following components:      Result Value   Group A Strep by PCR DETECTED (*)    All other components within normal limits   ____________________________________________  EKG  None ____________________________________________  RADIOLOGY  No results found.  ____________________________________________   PROCEDURES  Procedure(s) performed: None  Procedures  Critical Care performed: No  ____________________________________________   INITIAL IMPRESSION / ASSESSMENT AND PLAN / ED COURSE  As part of my medical decision making, I reviewed the following data within the electronic MEDICAL RECORD NUMBER Nursing notes reviewed and incorporated, Labs reviewed and Notes from prior ED visits.   31 year old male who presents with sore throats.  Rapid strep is positive.  We discussed options for antibiotics including IM Bicillin x 1 versus oral amoxicillin for 10 days.  Patient opts for IM Bicillin.  Will also add Decadron and Magic mouthwash for relief of patient's pain.  Strict return precautions given.  Patient verbalizes understanding and agrees with plan of care.      ____________________________________________   FINAL CLINICAL IMPRESSION(S) / ED DIAGNOSES  Final diagnoses:  Strep pharyngitis  Sore throat     ED Discharge Orders        Ordered    magic mouthwash SOLN  3 times daily PRN     03/25/17 0421        Note:  This document was prepared using Dragon voice recognition software and may include unintentional dictation errors.    Irean HongSung, Jade J, MD 03/25/17 [phone]

## 2017-03-25 NOTE — Discharge Instructions (Signed)
You have been treated with a shot of antibiotics and a shot of steroid here in the emergency department.  You may take Magic mouthwash as needed for throat discomfort.  Return to the ER for worsening symptoms, persistent vomiting, difficulty breathing or other concerns.

## 2017-03-25 NOTE — ED Triage Notes (Signed)
Patient ambulatory to triage with steady gait, without difficulty or distress noted; pt reports recent cold symptoms, now with sore throat

## 2017-03-25 NOTE — ED Notes (Addendum)
Pt states that he started with a cold about a month ago. Symptoms include sore throat and fever. Dr. Dolores FrameSung at bedside. Pt positive for Strep Throat.

## 2017-04-04 ENCOUNTER — Emergency Department
Admission: EM | Admit: 2017-04-04 | Discharge: 2017-04-04 | Disposition: A | Discharge status: Home / Self Care | Payer: Self-pay | Attending: Emergency Medicine | Admitting: Emergency Medicine

## 2017-04-04 ENCOUNTER — Other Ambulatory Visit: Payer: Self-pay

## 2017-04-04 DIAGNOSIS — K219 Gastro-esophageal reflux disease without esophagitis: Secondary | ICD-10-CM | POA: Insufficient documentation

## 2017-04-04 DIAGNOSIS — R05 Cough: Secondary | ICD-10-CM | POA: Insufficient documentation

## 2017-04-04 DIAGNOSIS — Z79899 Other long term (current) drug therapy: Secondary | ICD-10-CM | POA: Insufficient documentation

## 2017-04-04 MED ORDER — FAMOTIDINE 20 MG PO TABS: 20.0000 mg | ORAL_TABLET | Freq: Two times a day (BID) | ORAL | 0 refills | Status: DC

## 2017-04-04 MED ORDER — GI COCKTAIL ~~LOC~~
30.0000 mL | Freq: Once | ORAL | Status: AC
  Administered 2017-04-04: 30 mL via ORAL
  Filled 2017-04-04: qty 30

## 2017-04-04 NOTE — ED Triage Notes (Addendum)
Info obtained via [employer]  Serviced, Trinna PostAlex [phone]#[id number].  Reports sore throat for 2 months comes and goes.  States wants to be checked because feels like it is related to his ulcer.  Reports bitter taste in his mouth.

## 2017-04-04 NOTE — Discharge Instructions (Signed)
Please begin taking the medication for your stomach twice a day as prescribed for the next month and make an appointment to establish care with primary care within the next week for recheck.  Return to the emergency department for any concerns.  It was a pleasure to take care of you today, and thank you for coming to our emergency department.  If you have any questions or concerns before leaving please ask the nurse to grab me and I'm more than happy to go through your aftercare instructions again.  If you were prescribed any opioid pain medication today such as Norco, Vicodin, Percocet, morphine, hydrocodone, or oxycodone please make sure you do not drive when you are taking this medication as it can alter your ability to drive safely.  If you have any concerns once you are home that you are not improving or are in fact getting worse before you can make it to your follow-up appointment, please do not hesitate to call 911 and come back for further evaluation.  Merrily BrittleNeil Jasir Rother, MD

## 2017-04-04 NOTE — ED Notes (Signed)
Pt reports several months of "sore throat". Reports someone told him to get checked for reflux. Reports feelings of burning coming up in his throat intermittently. Denies fever or other sx.

## 2017-04-04 NOTE — ED Provider Notes (Signed)
[facility] Emergency Department Provider Note  ____________________________________________   First MD Initiated Contact with Patient 04/04/17 [phone]     (approximate)  I have reviewed the triage vital signs and the nursing notes.   HISTORY  Chief Complaint No chief complaint on file.   HPI Daniel Contreras is a 32 y.o. male who self presents to the emergency department with roughly 2 months of a bitter sour acid taste in his mouth in the morning that is worsened when eating spicy foods.  He also has a dry cough in the morning.  Is associated with some mild severity upper abdominal cramping sensation that feels like a burning rising in his chest.  A friend told him that he might have a stomach ulcer and so he wanted to come to the hospital to get evaluated.  He would not of come tonight however he brought his son in for an asthma exacerbation and he figured that while he was here he might as well get checked out as well.  He has taken no medications and nothing seems to have helped.  He does not have a doctor.  Past Medical History:  Diagnosis Date  . Seizures (HCC)     There are no active problems to display for this patient.   No past surgical history on file.  Prior to Admission medications   Medication Sig Start Date End Date Taking? Authorizing Provider  famotidine (PEPCID) 20 MG tablet Take 1 tablet (20 mg total) by mouth 2 (two) times daily. 04/04/17 04/04/18  Merrily Brittle, MD  gentamicin (GARAMYCIN) 0.3 % ophthalmic solution Place 1 drop into the left eye every 4 (four) hours. 10/13/16   Joni Reining, PA-C  levETIRAcetam (KEPPRA) 500 MG tablet Take 1 tablet (500 mg total) by mouth 2 (two) times daily. 08/18/15   Emily Filbert, MD  loperamide (IMODIUM A-D) 2 MG tablet Take 1 tablet (2 mg total) by mouth 4 (four) times daily as needed for diarrhea or loose stools. 04/15/16   Cuthriell, Delorise Royals, PA-C  magic mouthwash SOLN Take 5 mLs by  mouth 3 (three) times daily as needed for mouth pain. 03/25/17   Irean Hong, MD  naproxen (NAPROSYN) 500 MG tablet Take 1 tablet (500 mg total) by mouth 2 (two) times daily with a meal. 10/13/16   Joni Reining, PA-C  ondansetron (ZOFRAN-ODT) 4 MG disintegrating tablet Take 1 tablet (4 mg total) by mouth every 8 (eight) hours as needed for nausea or vomiting. 04/15/16   Cuthriell, Delorise Royals, PA-C    Allergies Patient has no known allergies.  No family history on file.  Social History Social History   Tobacco Use  . Smoking status: Never Smoker  . Smokeless tobacco: Never Used  Substance Use Topics  . Alcohol use: No  . Drug use: Not on file    Review of Systems Constitutional: No fever/chills ENT: Positive for sore throat. Cardiovascular: Denies chest pain. Respiratory: Denies shortness of breath. Gastrointestinal: Positive for abdominal pain.  Positive for nausea, no vomiting.  No diarrhea.  No constipation. Musculoskeletal: Negative for back pain. Neurological: Negative for headaches   ____________________________________________   PHYSICAL EXAM:  VITAL SIGNS: ED Triage Vitals [04/04/17 0332]  Enc Vitals Group     BP 123/84     Pulse Rate (!) 102     Resp 18     Temp 98.1 F (36.7 C)     Temp Source Oral     SpO2  97 %     Weight      Height      Head Circumference      Peak Flow      Pain Score      Pain Loc      Pain Edu?      Excl. in GC?     Constitutional: Alert and oriented x4 pleasant cooperative speaks in full clear sentences no diaphoresis Head: Atraumatic. Nose: No congestion/rhinnorhea. Mouth/Throat: No trismus uvula midline no pharyngeal erythema or exudate Neck: No stridor.   Cardiovascular: Regular rate and rhythm Respiratory: Normal respiratory effort.  No retractions. Gastrointestinal: Soft nontender Neurologic:  Normal speech and language. No gross focal neurologic deficits are appreciated.  Skin:  Skin is warm, dry and intact. No  rash noted.    ____________________________________________  LABS (all labs ordered are listed, but only abnormal results are displayed)  Labs Reviewed - No data to display   __________________________________________  EKG   ____________________________________________  RADIOLOGY   ____________________________________________   DIFFERENTIAL includes but not limited to  Gastritis, gastric reflux, duodenal ulcer, gastric ulcer, pharyngitis, esophagitis   PROCEDURES  Procedure(s) performed: no  Procedures  Critical Care performed: no  Observation: no ____________________________________________   INITIAL IMPRESSION / ASSESSMENT AND PLAN / ED COURSE  Pertinent labs & imaging results that were available during my care of the patient were reviewed by me and considered in my medical decision making (see chart for details).  The patient's symptoms are classic for acid reflux.  He feels improved after GI cocktail.  I will refer him to primary care as an outpatient and give him famotidine twice a day for the next month.  Strict return precautions have been given and the patient verbalizes understanding and agreement the plan.      ____________________________________________   FINAL CLINICAL IMPRESSION(S) / ED DIAGNOSES  Final diagnoses:  Gastroesophageal reflux disease, esophagitis presence not specified      NEW MEDICATIONS STARTED DURING THIS VISIT:  This SmartLink is deprecated. Use AVSMEDLIST instead to display the medication list for a patient.   Note:  This document was prepared using Dragon voice recognition software and may include unintentional dictation errors.      Merrily Brittleifenbark, Trevia Nop, MD 04/04/17 [phone]

## 2017-05-14 ENCOUNTER — Other Ambulatory Visit: Payer: Self-pay

## 2017-05-14 ENCOUNTER — Emergency Department: Payer: Self-pay

## 2017-05-14 DIAGNOSIS — Z79899 Other long term (current) drug therapy: Secondary | ICD-10-CM | POA: Insufficient documentation

## 2017-05-14 DIAGNOSIS — R110 Nausea: Secondary | ICD-10-CM | POA: Insufficient documentation

## 2017-05-14 DIAGNOSIS — R0602 Shortness of breath: Secondary | ICD-10-CM | POA: Insufficient documentation

## 2017-05-14 DIAGNOSIS — R0789 Other chest pain: Secondary | ICD-10-CM | POA: Insufficient documentation

## 2017-05-14 LAB — TROPONIN I

## 2017-05-14 LAB — BASIC METABOLIC PANEL
ANION GAP: 9 (ref 5–15)
BUN: 19 mg/dL (ref 6–20)
CO2: 26 mmol/L (ref 22–32)
Calcium: 9 mg/dL (ref 8.9–10.3)
Chloride: 102 mmol/L (ref 101–111)
Creatinine, Ser: 1 mg/dL (ref 0.61–1.24)
Glucose, Bld: 120 mg/dL — ABNORMAL HIGH (ref 65–99)
POTASSIUM: 3.9 mmol/L (ref 3.5–5.1)
SODIUM: 137 mmol/L (ref 135–145)

## 2017-05-14 LAB — CBC
HEMATOCRIT: 49.1 % (ref 40.0–52.0)
HEMOGLOBIN: 16.5 g/dL (ref 13.0–18.0)
MCH: 30.9 pg (ref 26.0–34.0)
MCHC: 33.6 g/dL (ref 32.0–36.0)
MCV: 91.9 fL (ref 80.0–100.0)
Platelets: 208 10*3/uL (ref 150–440)
RBC: 5.35 MIL/uL (ref 4.40–5.90)
RDW: 13.8 % (ref 11.5–14.5)
WBC: 7.9 10*3/uL (ref 3.8–10.6)

## 2017-05-14 NOTE — ED Notes (Signed)
Triage completed with hospital interpreter present

## 2017-05-14 NOTE — ED Triage Notes (Signed)
Pt states that he feels like he is going to have a seizure - he reports that he is having right sided headache with nausea - denies vomiting - reports feeling weak and having visual changes - pt also states that he feels chest pain

## 2017-05-15 ENCOUNTER — Emergency Department
Admission: EM | Admit: 2017-05-15 | Discharge: 2017-05-15 | Disposition: A | Discharge status: Home / Self Care | Payer: Self-pay | Attending: Emergency Medicine | Admitting: Emergency Medicine

## 2017-05-15 DIAGNOSIS — R0789 Other chest pain: Secondary | ICD-10-CM

## 2017-05-15 NOTE — ED Notes (Signed)
Unable to obtain e-signature; paper copy signed for scanning into EMR.

## 2017-05-15 NOTE — ED Notes (Signed)
D/c instructions reviewed with pt with aid of video interpreter Lissa HoardSonia [phone]#[id number]

## 2017-05-15 NOTE — ED Provider Notes (Signed)
[facility] Emergency Department Provider Note   ____________________________________________   I have reviewed the triage vital signs and the nursing notes.   HISTORY  Chief Complaint Chest pain  History limited by: Not Limited   HPI Daniel Contreras is a 32 y.o. male who presents to the emergency department today with chief complaint of chest pain. Located in the central chest. Describes it as tightness. Started yesterday. Has improved by the time of my examination. The patient had some nausea and shortness of breath associated with the pain. Denies any fevers. Denies similar symptoms in the past.    Per medical record review patient has a history of seizures.   Past Medical History:  Diagnosis Date  . Seizures (HCC)     There are no active problems to display for this patient.   History reviewed. No pertinent surgical history.  Prior to Admission medications   Medication Sig Start Date End Date Taking? Authorizing Provider  famotidine (PEPCID) 20 MG tablet Take 1 tablet (20 mg total) by mouth 2 (two) times daily. 04/04/17 04/04/18  Merrily Brittle, MD  gentamicin (GARAMYCIN) 0.3 % ophthalmic solution Place 1 drop into the left eye every 4 (four) hours. 10/13/16   Joni Reining, PA-C  levETIRAcetam (KEPPRA) 500 MG tablet Take 1 tablet (500 mg total) by mouth 2 (two) times daily. 08/18/15   Emily Filbert, MD  loperamide (IMODIUM A-D) 2 MG tablet Take 1 tablet (2 mg total) by mouth 4 (four) times daily as needed for diarrhea or loose stools. 04/15/16   Cuthriell, Delorise Royals, PA-C  magic mouthwash SOLN Take 5 mLs by mouth 3 (three) times daily as needed for mouth pain. 03/25/17   Irean Hong, MD  naproxen (NAPROSYN) 500 MG tablet Take 1 tablet (500 mg total) by mouth 2 (two) times daily with a meal. 10/13/16   Joni Reining, PA-C  ondansetron (ZOFRAN-ODT) 4 MG disintegrating tablet Take 1 tablet (4 mg total) by mouth every 8 (eight) hours as needed  for nausea or vomiting. 04/15/16   Cuthriell, Delorise Royals, PA-C    Allergies Patient has no known allergies.  No family history on file.  Social History Social History   Tobacco Use  . Smoking status: Never Smoker  . Smokeless tobacco: Never Used  Substance Use Topics  . Alcohol use: No  . Drug use: No    Review of Systems Constitutional: No fever/chills Eyes: No visual changes. ENT: No sore throat. Cardiovascular: Positive for chest pain. Respiratory: Positive for shortness of breath. Gastrointestinal: No abdominal pain.  Positive for nausea.  Genitourinary: Negative for dysuria. Musculoskeletal: Negative for back pain. Skin: Negative for rash. Neurological: Negative for headaches, focal weakness or numbness.  ____________________________________________   PHYSICAL EXAM:  VITAL SIGNS: ED Triage Vitals  Enc Vitals Group     BP 05/14/17 2235 125/87     Pulse Rate 05/14/17 2235 (!) 59     Resp 05/14/17 2235 16     Temp 05/14/17 2235 98 F (36.7 C)     Temp Source 05/14/17 2235 Oral     SpO2 05/14/17 2235 97 %     Weight 05/14/17 2240 151 lb (68.5 kg)     Height 05/14/17 2240 5\' 6"  (1.676 m)     Head Circumference --      Peak Flow --      Pain Score 05/14/17 2240 10   Constitutional: Alert and oriented. Well appearing and in no distress. Eyes: Conjunctivae are  normal.  ENT   Head: Normocephalic and atraumatic.   Nose: No congestion/rhinnorhea.   Mouth/Throat: Mucous membranes are moist.   Neck: No stridor. Hematological/Lymphatic/Immunilogical: No cervical lymphadenopathy. Cardiovascular: Normal rate, regular rhythm.  No murmurs, rubs, or gallops.  Respiratory: Normal respiratory effort without tachypnea nor retractions. Breath sounds are clear and equal bilaterally. No wheezes/rales/rhonchi. Gastrointestinal: Soft and non tender. No rebound. No guarding.  Genitourinary: Deferred Musculoskeletal: Normal range of motion in all extremities. No  lower extremity edema. Neurologic:  Normal speech and language. No gross focal neurologic deficits are appreciated.  Skin:  Skin is warm, dry and intact. No rash noted. Psychiatric: Mood and affect are normal. Speech and behavior are normal. Patient exhibits appropriate insight and judgment.  ____________________________________________    LABS (pertinent positives/negatives)  Trop <0.03 CBC wnl BMP wnl except glu 120  ____________________________________________   EKG  I, Phineas SemenGraydon Emmalia Heyboer, attending physician, personally viewed and interpreted this EKG  EKG Time: 2250 Rate: 72 Rhythm: normal sinus rhythm Axis: normal Intervals: qtc 420 QRS: narrow ST changes: no st elevation Impression: normal ekg  ____________________________________________    RADIOLOGY  CXR No acute disease  ____________________________________________   PROCEDURES  Procedures  ____________________________________________   INITIAL IMPRESSION / ASSESSMENT AND PLAN / ED COURSE  Pertinent labs & imaging results that were available during my care of the patient were reviewed by me and considered in my medical decision making (see chart for details).  Patient presented to the emergency department today with chief complaint of chest pain.  Differential would be broad including pneumonia, cardiac etiology, pneumothorax, PE, esophagitis, gastritis, costochondritis amongst other etiologies.  Workup without any concerning findings.  At the time my examination the patient was feeling better.  Will plan on discharging with primary care follow-up information.  ____________________________________________   FINAL CLINICAL IMPRESSION(S) / ED DIAGNOSES  Final diagnoses:  Atypical chest pain     Note: This dictation was prepared with Dragon dictation. Any transcriptional errors that result from this process are unintentional     Phineas SemenGoodman, Aylssa Herrig, MD 05/15/17 [phone]

## 2017-05-15 NOTE — Discharge Instructions (Signed)
Please seek medical attention for any high fevers, chest pain, shortness of breath, change in behavior, persistent vomiting, bloody stool or any other new or concerning symptoms.  

## 2017-07-01 ENCOUNTER — Emergency Department: Payer: Self-pay

## 2017-07-01 ENCOUNTER — Emergency Department
Admission: EM | Admit: 2017-07-01 | Discharge: 2017-07-01 | Disposition: A | Discharge status: Home / Self Care | Payer: Self-pay | Attending: Emergency Medicine | Admitting: Emergency Medicine

## 2017-07-01 ENCOUNTER — Other Ambulatory Visit: Payer: Self-pay

## 2017-07-01 DIAGNOSIS — J029 Acute pharyngitis, unspecified: Secondary | ICD-10-CM | POA: Insufficient documentation

## 2017-07-01 DIAGNOSIS — Y999 Unspecified external cause status: Secondary | ICD-10-CM | POA: Insufficient documentation

## 2017-07-01 DIAGNOSIS — W19XXXA Unspecified fall, initial encounter: Secondary | ICD-10-CM | POA: Insufficient documentation

## 2017-07-01 DIAGNOSIS — Y939 Activity, unspecified: Secondary | ICD-10-CM | POA: Insufficient documentation

## 2017-07-01 DIAGNOSIS — S63622A Sprain of interphalangeal joint of left thumb, initial encounter: Secondary | ICD-10-CM | POA: Insufficient documentation

## 2017-07-01 DIAGNOSIS — Z79899 Other long term (current) drug therapy: Secondary | ICD-10-CM | POA: Insufficient documentation

## 2017-07-01 DIAGNOSIS — Y929 Unspecified place or not applicable: Secondary | ICD-10-CM | POA: Insufficient documentation

## 2017-07-01 MED ORDER — LIDOCAINE VISCOUS 2 % MT SOLN: 5.0000 mL | Freq: Four times a day (QID) | OROMUCOSAL | 0 refills | Status: DC | PRN

## 2017-07-01 MED ORDER — DIPHENHYDRAMINE HCL 12.5 MG/5ML PO SYRP: 12.5000 mg | ORAL_SOLUTION | Freq: Four times a day (QID) | ORAL | 0 refills | Status: DC | PRN

## 2017-07-01 MED ORDER — METHYLPREDNISOLONE 4 MG PO TBPK: ORAL_TABLET | ORAL | 0 refills | Status: DC

## 2017-07-01 MED ORDER — FEXOFENADINE-PSEUDOEPHED ER 60-120 MG PO TB12: 1.0000 | ORAL_TABLET | Freq: Two times a day (BID) | ORAL | 0 refills | Status: DC

## 2017-07-01 MED ORDER — HYDROMORPHONE HCL 1 MG/ML IJ SOLN: 1.0000 mg | Freq: Once | INTRAMUSCULAR | Status: DC

## 2017-07-01 MED ORDER — KETOROLAC TROMETHAMINE 60 MG/2ML IM SOLN: 60.0000 mg | Freq: Once | INTRAMUSCULAR | Status: DC

## 2017-07-01 MED ORDER — ORPHENADRINE CITRATE 30 MG/ML IJ SOLN: 60.0000 mg | Freq: Two times a day (BID) | INTRAMUSCULAR | Status: DC

## 2017-07-01 NOTE — ED Triage Notes (Signed)
Interpretor present for triage. Last four months pt has had a sore throat that comes and goes. Pt states yesterday pain increased to an 8 out of 10 pain scale. Pt states yesterday when coughing started coughing up blood in sputum.

## 2017-07-01 NOTE — ED Provider Notes (Signed)
[facility] Emergency Department Provider Note   ____________________________________________   First MD Initiated Contact with Patient 07/01/17 [phone]     (approximate)  I have reviewed the triage vital signs and the nursing notes.   HISTORY PA interpreter Chief Complaint Sore Throat    HPI Daniel Contreras is a 32 y.o. male patient complain of full months of sore throat secondary to postnasal drainage.  Patient state sore throat wax and wane but is increased yesterday causing a cough which was tinged with blood.  Patient states facial pain is also increased.  Patient state swallowing difficulties with solids but can tolerate fluids.  Transient relief of sore throat with lozenges.  Patient denies fevers associated with this complaint.  Patient denies nausea, vomiting, diarrhea.  Patient does not smoke.  Patient has a history of gastric reflux which resolved with medication.  Patient rates his pain as 8 out of 10 this morning.  Patient describes his pain is "achy".  No palliative measures for complaint.  Patient also complain of left thumb pain secondary to a fall 1 week ago.  Patient state pain increased with flexion of the thumb and the edema has not resolved.  Past Medical History:  Diagnosis Date  . Seizures (HCC)     There are no active problems to display for this patient.   History reviewed. No pertinent surgical history.  Prior to Admission medications   Medication Sig Start Date End Date Taking? Authorizing Provider  diphenhydrAMINE (BENYLIN) 12.5 MG/5ML syrup Take 5 mLs (12.5 mg total) by mouth 4 (four) times daily as needed for allergies. 07/01/17   Joni ReiningSmith, Akira Perusse K, PA-C  famotidine (PEPCID) 20 MG tablet Take 1 tablet (20 mg total) by mouth 2 (two) times daily. 04/04/17 04/04/18  Merrily Brittleifenbark, Neil, MD  fexofenadine-pseudoephedrine (ALLEGRA-D) 60-120 MG 12 hr tablet Take 1 tablet by mouth 2 (two) times daily. 07/01/17   Joni ReiningSmith, Jessie Schrieber K, PA-C  gentamicin  (GARAMYCIN) 0.3 % ophthalmic solution Place 1 drop into the left eye every 4 (four) hours. 10/13/16   Joni ReiningSmith, Quinlin Conant K, PA-C  levETIRAcetam (KEPPRA) 500 MG tablet Take 1 tablet (500 mg total) by mouth 2 (two) times daily. 08/18/15   Emily FilbertWilliams, Jonathan E, MD  lidocaine (XYLOCAINE) 2 % solution Use as directed 5 mLs in the mouth or throat every 6 (six) hours as needed for mouth pain. Mix with 5 mL of Benadryl for swish and swallow. 07/01/17   Joni ReiningSmith, Kanika Bungert K, PA-C  loperamide (IMODIUM A-D) 2 MG tablet Take 1 tablet (2 mg total) by mouth 4 (four) times daily as needed for diarrhea or loose stools. 04/15/16   Cuthriell, Delorise RoyalsJonathan D, PA-C  magic mouthwash SOLN Take 5 mLs by mouth 3 (three) times daily as needed for mouth pain. 03/25/17   Irean HongSung, Jade J, MD  methylPREDNISolone (MEDROL DOSEPAK) 4 MG TBPK tablet Take Tapered dose as directed 07/01/17   Joni ReiningSmith, Savior Himebaugh K, PA-C  naproxen (NAPROSYN) 500 MG tablet Take 1 tablet (500 mg total) by mouth 2 (two) times daily with a meal. 10/13/16   Joni ReiningSmith, Matilyn Fehrman K, PA-C  ondansetron (ZOFRAN-ODT) 4 MG disintegrating tablet Take 1 tablet (4 mg total) by mouth every 8 (eight) hours as needed for nausea or vomiting. 04/15/16   Cuthriell, Delorise RoyalsJonathan D, PA-C    Allergies Patient has no known allergies.  History reviewed. No pertinent family history.  Social History Social History   Tobacco Use  . Smoking status: Never Smoker  . Smokeless tobacco: Never Used  Substance Use Topics  . Alcohol use: No  . Drug use: No    Review of Systems Constitutional: No fever/chills Eyes: No visual changes. ENT: Nasal congestion and sore throat Cardiovascular: Denies chest pain. Respiratory: Denies shortness of breath. Gastrointestinal: No abdominal pain.  No nausea, no vomiting.  No diarrhea.  No constipation. Genitourinary: Negative for dysuria. Musculoskeletal: Negative for back pain. Skin: Negative for rash. Neurological: Negative for headaches, focal weakness or  numbness.   ____________________________________________   PHYSICAL EXAM:  VITAL SIGNS: ED Triage Vitals  Enc Vitals Group     BP      Pulse      Resp      Temp      Temp src      SpO2      Weight      Height      Head Circumference      Peak Flow      Pain Score      Pain Loc      Pain Edu?      Excl. in GC?    Constitutional: Alert and oriented. Well appearing and in no acute distress. Nose: Bilateral maxillary guarding edematous nasal turbinates. Mouth/Throat: Mucous membranes are moist.  Oropharynx non-erythematous.  Tonsils are nonedematous or exudative. Neck: No stridor.  Hematological/Lymphatic/Immunilogical: No cervical lymphadenopathy. Cardiovascular: Normal rate, regular rhythm. Grossly normal heart sounds.  Good peripheral circulation. Respiratory: Normal respiratory effort.  No retractions. Lungs CTAB. Neurologic:  Normal speech and language. No gross focal neurologic deficits are appreciated. No gait instability. Skin:  Skin is warm, dry and intact. No rash noted. Psychiatric: Mood and affect are normal. Speech and behavior are normal.  ____________________________________________   LABS (all labs ordered are listed, but only abnormal results are displayed)  Labs Reviewed - No data to display ____________________________________________  EKG   ____________________________________________  RADIOLOGY No acute findings x-ray of the left thumb. ED MD interpretation:    Official radiology report(s): Dg Sinuses Complete  Result Date: 07/01/2017 CLINICAL DATA:  Pain and sore throat EXAM: PARANASAL SINUSES - COMPLETE 3 + VIEW COMPARISON:  None. FINDINGS: Water's, lateral, Towne's, and submentovertex images were obtained. Paranasal sinuses are clear. No air-fluid level. No bony destruction or expansion. Mastoid air cells appear clear. Nasal septum is midline. IMPRESSION: Paranasal sinuses and mastoids appear clear. Electronically Signed   By: Bretta Bang III M.D.   On: 07/01/2017 09:19   Dg Finger Thumb Left  Result Date: 07/01/2017 CLINICAL DATA:  Pain and swelling following fall EXAM: LEFT THUMB 2+V COMPARISON:  None. FINDINGS: Frontal, oblique, and lateral views obtained. No fracture or dislocation. Joint spaces appear normal. No erosive change. IMPRESSION: No fracture or dislocation.  No evident arthropathy. Electronically Signed   By: Bretta Bang III M.D.   On: 07/01/2017 10:16    ____________________________________________   PROCEDURES  Procedure(s) performed: None  Procedures  Critical Care performed: No  ____________________________________________   INITIAL IMPRESSION / ASSESSMENT AND PLAN / ED COURSE  As part of my medical decision making, I reviewed the following data within the electronic MEDICAL RECORD NUMBER    Pharyngitis secondary to postnasal drainage.  Patient also has a sprain left thumb.  Patient given discharge care instructions and advised to follow-up with the ENT clinic if no improvement of sore throat in 1 week.  Take medication as directed.      ____________________________________________   FINAL CLINICAL IMPRESSION(S) / ED DIAGNOSES  Final diagnoses:  Viral pharyngitis  Sprain of  interphalangeal joint of left thumb, initial encounter     ED Discharge Orders        Ordered    fexofenadine-pseudoephedrine (ALLEGRA-D) 60-120 MG 12 hr tablet  2 times daily     07/01/17 0947    methylPREDNISolone (MEDROL DOSEPAK) 4 MG TBPK tablet     07/01/17 0947    lidocaine (XYLOCAINE) 2 % solution  Every 6 hours PRN     07/01/17 0947    diphenhydrAMINE (BENYLIN) 12.5 MG/5ML syrup  4 times daily PRN     07/01/17 0947       Note:  This document was prepared using Dragon voice recognition software and may include unintentional dictation errors.    Joni Reining, PA-C 07/01/17 1043    Jene Every, MD 07/02/17 [phone]

## 2017-07-01 NOTE — ED Notes (Signed)
Waiting on interpretor. 

## 2017-08-12 ENCOUNTER — Emergency Department: Payer: Self-pay

## 2017-08-12 ENCOUNTER — Emergency Department
Admission: EM | Admit: 2017-08-12 | Discharge: 2017-08-12 | Disposition: A | Discharge status: Home / Self Care | Payer: Self-pay | Attending: Emergency Medicine | Admitting: Emergency Medicine

## 2017-08-12 ENCOUNTER — Encounter: Payer: Self-pay | Admitting: Emergency Medicine

## 2017-08-12 ENCOUNTER — Other Ambulatory Visit: Payer: Self-pay

## 2017-08-12 DIAGNOSIS — R1010 Upper abdominal pain, unspecified: Secondary | ICD-10-CM

## 2017-08-12 DIAGNOSIS — R1013 Epigastric pain: Secondary | ICD-10-CM | POA: Insufficient documentation

## 2017-08-12 LAB — TROPONIN I

## 2017-08-12 LAB — CBC
HCT: 47.4 % (ref 40.0–52.0)
Hemoglobin: 16.3 g/dL (ref 13.0–18.0)
MCH: 32 pg (ref 26.0–34.0)
MCHC: 34.3 g/dL (ref 32.0–36.0)
MCV: 93.2 fL (ref 80.0–100.0)
PLATELETS: 201 10*3/uL (ref 150–440)
RBC: 5.09 MIL/uL (ref 4.40–5.90)
RDW: 13.6 % (ref 11.5–14.5)
WBC: 6.3 10*3/uL (ref 3.8–10.6)

## 2017-08-12 LAB — BASIC METABOLIC PANEL
Anion gap: 7 (ref 5–15)
BUN: 23 mg/dL — ABNORMAL HIGH (ref 6–20)
CALCIUM: 9.1 mg/dL (ref 8.9–10.3)
CO2: 26 mmol/L (ref 22–32)
CREATININE: 0.9 mg/dL (ref 0.61–1.24)
Chloride: 103 mmol/L (ref 101–111)
Glucose, Bld: 103 mg/dL — ABNORMAL HIGH (ref 65–99)
Potassium: 4 mmol/L (ref 3.5–5.1)
SODIUM: 136 mmol/L (ref 135–145)

## 2017-08-12 LAB — LIPASE, BLOOD: Lipase: 24 U/L (ref 11–51)

## 2017-08-12 MED ORDER — FAMOTIDINE 20 MG PO TABS: 20.0000 mg | ORAL_TABLET | Freq: Two times a day (BID) | ORAL | 1 refills | Status: DC

## 2017-08-12 MED ORDER — DIAZEPAM 5 MG PO TABS: 5.0000 mg | ORAL_TABLET | Freq: Three times a day (TID) | ORAL | 0 refills | Status: DC | PRN

## 2017-08-12 MED ORDER — OXYCODONE-ACETAMINOPHEN 5-325 MG PO TABS
2.0000 | ORAL_TABLET | Freq: Once | ORAL | Status: AC
  Administered 2017-08-12: 2 via ORAL
  Filled 2017-08-12: qty 2

## 2017-08-12 NOTE — ED Provider Notes (Signed)
[facility] Emergency Department Provider Note       Time seen: ----------------------------------------- 11:10 AM on 08/12/2017 -----------------------------------------   I have reviewed the triage vital signs and the nursing notes.  HISTORY   Chief Complaint Chest Pain  HPI Daniel Contreras is a 32 y.o. male with a history of seizures who presents to the ED for epigastric pain.  Patient describes pain for the past 3 weeks.  He states there is a knot in the epigastric area and has had burning and hurting this morning that is worse.  He describes decreased appetite recently as well, denies fevers, chills, vomiting or diarrhea.  He states he does not drink or smoke.  Past Medical History:  Diagnosis Date  . Seizures (HCC)     There are no active problems to display for this patient.   History reviewed. No pertinent surgical history.  Allergies Patient has no known allergies.  Social History Social History   Tobacco Use  . Smoking status: Never Smoker  . Smokeless tobacco: Never Used  Substance Use Topics  . Alcohol use: No  . Drug use: No   Review of Systems Constitutional: Negative for fever. Cardiovascular: Negative for chest pain. Respiratory: Negative for shortness of breath. Gastrointestinal: Positive for epigastric pain Genitourinary: Negative for dysuria. Musculoskeletal: Negative for back pain. Skin: Negative for rash. Neurological: Negative for headaches, focal weakness or numbness.  All systems negative/normal/unremarkable except as stated in the HPI  ____________________________________________   PHYSICAL EXAM:  VITAL SIGNS: ED Triage Vitals  Enc Vitals Group     BP 08/12/17 1011 119/74     Pulse Rate 08/12/17 1011 (!) 56     Resp 08/12/17 1011 18     Temp 08/12/17 1011 98.2 F (36.8 C)     Temp Source 08/12/17 1011 Oral     SpO2 08/12/17 1011 100 %     Weight 08/12/17 1022 153 lb (69.4 kg)     Height 08/12/17  1022  (1.727 m)     Head Circumference --      Peak Flow --      Pain Score 08/12/17 1021 3     Pain Loc --      Pain Edu? --      Excl. in GC? --    Constitutional: Alert and oriented. Well appearing and in no distress. Eyes: Conjunctivae are normal. Normal extraocular movements. Cardiovascular: Normal rate, regular rhythm. No murmurs, rubs, or gallops. Respiratory: Normal respiratory effort without tachypnea nor retractions. Breath sounds are clear and equal bilaterally. No wheezes/rales/rhonchi. Gastrointestinal: Epigastric and xiphoid process tenderness, no other abnormalities noted Musculoskeletal: Nontender with normal range of motion in extremities. No lower extremity tenderness nor edema. Neurologic:  Normal speech and language. No gross focal neurologic deficits are appreciated.  Skin:  Skin is warm, dry and intact. No rash noted. Psychiatric: Mood and affect are normal. Speech and behavior are normal.  ____________________________________________  EKG: Interpreted by me.  Sinus bradycardia with a rate of 59 bpm, normal PR interval, normal QRS, normal QT.  ____________________________________________  ED COURSE:  As part of my medical decision making, I reviewed the following data within the electronic MEDICAL RECORD NUMBER History obtained from family if available, nursing notes, old chart and ekg, as well as notes from prior ED visits. Patient presented for epigastric pain, we will assess with labs and imaging as indicated at this time.   Procedures ____________________________________________   LABS (pertinent positives/negatives)  Labs Reviewed  BASIC  METABOLIC PANEL - Abnormal; Notable for the following components:      Result Value   Glucose, Bld 103 (*)    BUN 23 (*)    All other components within normal limits  CBC  TROPONIN I  LIPASE, BLOOD    RADIOLOGY  Chest x-ray is normal  ____________________________________________  DIFFERENTIAL DIAGNOSIS    Musculoskeletal pain, GERD, peptic ulcer disease, pancreatitis  FINAL ASSESSMENT AND PLAN  Epigastric pain   Plan: The patient had presented for epigastric pain which appears to be musculoskeletal in origin. Patient's labs are reassuring. Patient's imaging was also reassuring.  Patient will be discharged with antacids and Valium.  He is stable for outpatient follow-up.   Ulice Dash, MD   Note: This note was generated in part or whole with voice recognition software. Voice recognition is usually quite accurate but there are transcription errors that can and very often do occur. I apologize for any typographical errors that were not detected and corrected.     Emily Filbert, MD 08/12/17 1128

## 2017-08-12 NOTE — ED Triage Notes (Signed)
Pt states "knot" in epigastric area, began burning and hurting this am. Pt states he can feel the knot when he presses on it. Appears in NAD, states decreased appetite recently as well.

## 2017-08-12 NOTE — ED Notes (Signed)
Pt triaged with hospital interpretor.

## 2017-08-12 NOTE — ED Notes (Signed)
First Nurse Note:  Patient to room 1, Jae Dire RN aware of placement.

## 2017-12-14 ENCOUNTER — Encounter: Payer: Self-pay | Admitting: Emergency Medicine

## 2017-12-14 ENCOUNTER — Emergency Department: Payer: Self-pay

## 2017-12-14 ENCOUNTER — Emergency Department
Admission: EM | Admit: 2017-12-14 | Discharge: 2017-12-14 | Disposition: A | Discharge status: Home / Self Care | Payer: Self-pay | Attending: Emergency Medicine | Admitting: Emergency Medicine

## 2017-12-14 DIAGNOSIS — Y929 Unspecified place or not applicable: Secondary | ICD-10-CM | POA: Insufficient documentation

## 2017-12-14 DIAGNOSIS — N50812 Left testicular pain: Secondary | ICD-10-CM

## 2017-12-14 DIAGNOSIS — W51XXXA Accidental striking against or bumped into by another person, initial encounter: Secondary | ICD-10-CM | POA: Insufficient documentation

## 2017-12-14 DIAGNOSIS — M25552 Pain in left hip: Secondary | ICD-10-CM | POA: Insufficient documentation

## 2017-12-14 DIAGNOSIS — Y9366 Activity, soccer: Secondary | ICD-10-CM | POA: Insufficient documentation

## 2017-12-14 DIAGNOSIS — Z79899 Other long term (current) drug therapy: Secondary | ICD-10-CM | POA: Insufficient documentation

## 2017-12-14 DIAGNOSIS — Y999 Unspecified external cause status: Secondary | ICD-10-CM | POA: Insufficient documentation

## 2017-12-14 DIAGNOSIS — S3022XA Contusion of scrotum and testes, initial encounter: Secondary | ICD-10-CM | POA: Insufficient documentation

## 2017-12-14 MED ORDER — HYDROMORPHONE HCL 1 MG/ML IJ SOLN
1.0000 mg | Freq: Once | INTRAMUSCULAR | Status: AC
  Administered 2017-12-14: 1 mg via INTRAMUSCULAR
  Filled 2017-12-14: qty 1

## 2017-12-14 MED ORDER — NAPROXEN 500 MG PO TABS: 500.0000 mg | ORAL_TABLET | Freq: Two times a day (BID) | ORAL | Status: DC

## 2017-12-14 MED ORDER — KETOROLAC TROMETHAMINE 60 MG/2ML IM SOLN
60.0000 mg | Freq: Once | INTRAMUSCULAR | Status: AC
  Administered 2017-12-14: 60 mg via INTRAMUSCULAR
  Filled 2017-12-14: qty 2

## 2017-12-14 MED ORDER — OXYCODONE-ACETAMINOPHEN 7.5-325 MG PO TABS: 1.0000 | ORAL_TABLET | Freq: Four times a day (QID) | ORAL | 0 refills | Status: DC | PRN

## 2017-12-14 NOTE — Discharge Instructions (Signed)
Observe la irritacion de la bolsa de los testiculos por algun cambio. Puede usar las siguientes recomendaciones para Engineer, materialsaliviar el dolor:  Use hielo en el area lastimada. Coloque el hielo en [employer] Corporationuna bolsa. Use una toalla entre la bolsa de hielo y su piel [place] por 20 minutos dos a tres veces por dia. Use un calzoncillo de deportes para mejor soporte. Tome medicinas segun prescritas. Minimize actividades fisicas que afecten la bolsa de los testiculos como montar bicicleta o montar caballo. Evite actividad sexual, hasta que su Doctor le indique.

## 2017-12-14 NOTE — ED Triage Notes (Signed)
Patient presents to the ED with left leg pain that he states began last night around 10:30 pm.  Patient states he was playing soccer yesterday and may have hurt leg at that time.  Patient is pointing to left hip area that hurts the most.  Patient is very uncomfortable in triage and crying.  Patient had to be assisted from vehicle by [employer] officer.

## 2017-12-14 NOTE — ED Provider Notes (Signed)
[facility] Emergency Department Provider Note   ____________________________________________   First MD Initiated Contact with Patient 12/14/17 [phone]     (approximate)  I have reviewed the triage vital signs and the nursing notes.   HISTORY Via interpreter Chief Complaint Leg Pain    HPI Ringo Vilinda FlakeJaimes Silva is a 32 y.o. male patient complain of posterior left hip pain that radiates to the testicle secondary to a fall last night.  Patient states pain soccer ran into a person and fell backwards landing on her left hip.  Patient arrived via POV but had assistance exiting the vehicle by security guard.  Patient rates pain as a 10/10.  Patient described the pain is "sharp/aching".  No palliative measures prior to arrival.  Patient has a history of seizures but no episodes in over 2 years.  No medication for seizures.  Past Medical History:  Diagnosis Date  . Seizures (HCC)     There are no active problems to display for this patient.   History reviewed. No pertinent surgical history.  Prior to Admission medications   Medication Sig Start Date End Date Taking? Authorizing Provider  diazepam (VALIUM) 5 MG tablet Take 1 tablet (5 mg total) by mouth every 8 (eight) hours as needed for muscle spasms. 08/12/17   Emily FilbertWilliams, Jonathan E, MD  diphenhydrAMINE (BENYLIN) 12.5 MG/5ML syrup Take 5 mLs (12.5 mg total) by mouth 4 (four) times daily as needed for allergies. 07/01/17   Joni ReiningSmith, Shaguana Love K, PA-C  famotidine (PEPCID) 20 MG tablet Take 1 tablet (20 mg total) by mouth 2 (two) times daily. 04/04/17 04/04/18  Merrily Brittleifenbark, Neil, MD  famotidine (PEPCID) 20 MG tablet Take 1 tablet (20 mg total) by mouth 2 (two) times daily. 08/12/17   Emily FilbertWilliams, Jonathan E, MD  fexofenadine-pseudoephedrine (ALLEGRA-D) 60-120 MG 12 hr tablet Take 1 tablet by mouth 2 (two) times daily. 07/01/17   Joni ReiningSmith, Aleesia Henney K, PA-C  gentamicin (GARAMYCIN) 0.3 % ophthalmic solution Place 1 drop into the left eye every 4  (four) hours. 10/13/16   Joni ReiningSmith, Harjit Leider K, PA-C  levETIRAcetam (KEPPRA) 500 MG tablet Take 1 tablet (500 mg total) by mouth 2 (two) times daily. 08/18/15   Emily FilbertWilliams, Jonathan E, MD  lidocaine (XYLOCAINE) 2 % solution Use as directed 5 mLs in the mouth or throat every 6 (six) hours as needed for mouth pain. Mix with 5 mL of Benadryl for swish and swallow. 07/01/17   Joni ReiningSmith, Ronin Crager K, PA-C  loperamide (IMODIUM A-D) 2 MG tablet Take 1 tablet (2 mg total) by mouth 4 (four) times daily as needed for diarrhea or loose stools. 04/15/16   Cuthriell, Delorise RoyalsJonathan D, PA-C  magic mouthwash SOLN Take 5 mLs by mouth 3 (three) times daily as needed for mouth pain. 03/25/17   Irean HongSung, Jade J, MD  methylPREDNISolone (MEDROL DOSEPAK) 4 MG TBPK tablet Take Tapered dose as directed 07/01/17   Joni ReiningSmith, Benjamen Koelling K, PA-C  naproxen (NAPROSYN) 500 MG tablet Take 1 tablet (500 mg total) by mouth 2 (two) times daily with a meal. 10/13/16   Joni ReiningSmith, Ionia Schey K, PA-C  naproxen (NAPROSYN) 500 MG tablet Take 1 tablet (500 mg total) by mouth 2 (two) times daily with a meal. 12/14/17   Joni ReiningSmith, Tamel Abel K, PA-C  ondansetron (ZOFRAN-ODT) 4 MG disintegrating tablet Take 1 tablet (4 mg total) by mouth every 8 (eight) hours as needed for nausea or vomiting. 04/15/16   Cuthriell, Delorise RoyalsJonathan D, PA-C  oxyCODONE-acetaminophen (PERCOCET) 7.5-325 MG tablet Take 1 tablet by mouth  every 6 (six) hours as needed. 12/14/17   Joni Reining, PA-C    Allergies Patient has no known allergies.  No family history on file.  Social History Social History   Tobacco Use  . Smoking status: Never Smoker  . Smokeless tobacco: Never Used  Substance Use Topics  . Alcohol use: No  . Drug use: No    Review of Systems Constitutional: No fever/chills Eyes: No visual changes. ENT: No sore throat. Cardiovascular: Denies chest pain. Respiratory: Denies shortness of breath. Gastrointestinal: No abdominal pain.  No nausea, no vomiting.  No diarrhea.  No constipation. Genitourinary:  Left scrotum pain. Musculoskeletal: Left hip pain.. Skin: Negative for rash. Neurological: Negative for headaches, focal weakness or numbness.   ____________________________________________   PHYSICAL EXAM:  VITAL SIGNS: ED Triage Vitals [12/14/17 0722]  Enc Vitals Group     BP 131/82     Pulse Rate 73     Resp 18     Temp 99 F (37.2 C)     Temp Source Oral     SpO2 98 %     Weight 180 lb (81.6 kg)     Height 5\' 6"  (1.676 m)     Head Circumference      Peak Flow      Pain Score 10     Pain Loc      Pain Edu?      Excl. in GC?   Constitutional: Alert and oriented.  Moderate distress secondary to pain. Cardiovascular: Normal rate, regular rhythm. Grossly normal heart sounds.  Good peripheral circulation. Respiratory: Normal respiratory effort.  No retractions. Lungs CTAB. Gastrointestinal: Soft and nontender. No distention. No abdominal bruits. No CVA tenderness. Genitourinary: Moderate guarding palpation left scrotum. Musculoskeletal: No obvious deformity to the left hip.  No leg length discrepancy.  Neurologic:  Normal speech and language. No gross focal neurologic deficits are appreciated. No gait instability. Skin:  Skin is warm, dry and intact. No rash noted.  Ecchymosis left anterior thigh Psychiatric: Mood and affect are normal. Speech and behavior are normal.  ____________________________________________   LABS (all labs ordered are listed, but only abnormal results are displayed)  Labs Reviewed - No data to display ____________________________________________  EKG   ____________________________________________  RADIOLOGY  ED MD interpretation:    Official radiology report(s): US Scrotum W/doppler  Result Date: 12/14/2017 CLINICAL DATA:  Pain following recent fall EXAM: SCROTAL ULTRASOUND DOPPLER ULTRASOUND OF THE TESTICLES TECHNIQUE: Complete ultrasound examination of the testicles, epididymis, and other scrotal structures was performed. Color and  spectral Doppler ultrasound were also utilized to evaluate blood flow to the testicles. COMPARISON:  None. FINDINGS: Right testicle Measurements: 4 4.9 x 1.9 x 2.6 cm no mass or microlithiasis visualized. Left testicle Measurements: 4.3 x 2.0 x 2.7 cm. No mass or microlithiasis visualized. Right epididymis: Normal in size and appearance. No evident inflammatory focus. Left epididymis: Normal in size and appearance. No evident inflammatory focus. Hydrocele:  None visualized. Varicocele:  None visualized. Pulsed Doppler interrogation of both testes demonstrates normal low resistance arterial and venous waveforms bilaterally. There is a well-circumscribed masslike area adjacent to the head of the epididymis on the left measuring 1.9 x 1.4 cm. This area may represent a localized hematoma. This area does not show appreciable vascularity. IMPRESSION: Suspect hematoma adjacent to the head of the epididymis on the left. This masslike area appears nonvascular. No associated hydrocele. No orchitis or epididymitis evident. No intratesticular mass or torsion on either side. Electronically Signed  By: Bretta Bang III M.D.   On: 12/14/2017 09:32   Dg Hip Unilat W Or Wo Pelvis 2-3 Views Left  Result Date: 12/14/2017 CLINICAL DATA:  Pain following fall EXAM: DG HIP (WITH OR WITHOUT PELVIS) 2-3V LEFT COMPARISON:  None. FINDINGS: Frontal pelvis as well as frontal and lateral left hip images were obtained. There is no fracture or dislocation. Joint spaces appear normal. No erosive change. IMPRESSION: No fracture or dislocation.  No evident arthropathy. Electronically Signed   By: Bretta Bang III M.D.   On: 12/14/2017 08:37    ____________________________________________   PROCEDURES  Procedure(s) performed: None  Procedures  Critical Care performed: No  ____________________________________________   INITIAL IMPRESSION / ASSESSMENT AND PLAN / ED COURSE  As part of my medical decision making, I  reviewed the following data within the electronic MEDICAL RECORD NUMBER    Left hip and scrotum pain secondary to fall.  Differential consist of fractured hip, epididymitis or scrotal torsion.  X-rays of the hip were unremarkable.  Scrotum ultrasound shows suspected hematoma on the left superior aspect of the epididymis.  Discussed x-ray and ultrasound findings with patient.  Patient will follow-up with urology for definitive evaluation and treatment.  Patient given prescription for Percocet as an a work note.      ____________________________________________   FINAL CLINICAL IMPRESSION(S) / ED DIAGNOSES  Final diagnoses:  Traumatic hematoma of scrotum, initial encounter  Posterior pain of left hip     ED Discharge Orders         Ordered    oxyCODONE-acetaminophen (PERCOCET) 7.5-325 MG tablet  Every 6 hours PRN     12/14/17 1017    naproxen (NAPROSYN) 500 MG tablet  2 times daily with meals     12/14/17 1017           Note:  This document was prepared using Dragon voice recognition software and may include unintentional dictation errors.    Joni Reining, PA-C 12/14/17 1025    Jene Every, MD 12/14/17 1201

## 2017-12-14 NOTE — ED Notes (Signed)
Using spanish interpreter for clarification:  Patient fell while playing soccer onto his left side.  It was not painful until later.  Patient describes pain as intermittent stabbing.  Patient states his penis and both testicles are also very painful.  Patient states he can only stand a small amount.

## 2017-12-15 ENCOUNTER — Ambulatory Visit (INDEPENDENT_AMBULATORY_CARE_PROVIDER_SITE_OTHER): Payer: Self-pay | Admitting: Urology

## 2017-12-15 ENCOUNTER — Encounter: Payer: Self-pay | Admitting: Urology

## 2017-12-15 ENCOUNTER — Other Ambulatory Visit: Payer: Self-pay

## 2017-12-15 VITALS — BP 96/63 | HR 60 | Ht 66.0 in | Wt 152.8 lb

## 2017-12-15 DIAGNOSIS — S3994XA Unspecified injury of external genitals, initial encounter: Secondary | ICD-10-CM | POA: Insufficient documentation

## 2017-12-15 LAB — URINALYSIS, COMPLETE
Bilirubin, UA: NEGATIVE
Glucose, UA: NEGATIVE
Nitrite, UA: NEGATIVE
PH UA: 6 (ref 5.0–7.5)
RBC, UA: NEGATIVE
Specific Gravity, UA: 1.02 (ref 1.005–1.030)
Urobilinogen, Ur: 1 mg/dL (ref 0.2–1.0)

## 2017-12-15 LAB — MICROSCOPIC EXAMINATION
Epithelial Cells (non renal): NONE SEEN /hpf (ref 0–10)
RBC, UA: NONE SEEN /hpf (ref 0–2)

## 2017-12-15 NOTE — Progress Notes (Signed)
   12/15/2017 1:52 PM   Daniel RileyLeonel Daniel Contreras 04/13/1985 [id number]  CC: Left scrotal pain  HPI: I had the pleasure of seeing Mr. Daniel Contreras in urology clinic today for follow-up of scrotal injury.  He is a 32 year old male with history of seizure disorder that presented to the ED yesterday after a significant fall with hip and scrotal pain playing soccer.  Scrotal ultrasound at that time showed a small approximately 2 cm hematoma adjacent to the left epididymis.  There was no testicular mass.  He is voiding normally.  He denies any prior urologic problems.  Ambulation and activity makes the pain worse.  He was prescribed naproxen and Norco by the ED for pain control.  Hip x-ray was negative.  He denies fevers, chills.   PMH: Past Medical History:  Diagnosis Date  . Seizures (HCC)     Surgical History: History reviewed. No pertinent surgical history.  Allergies: No Known Allergies  Family History: History reviewed. No pertinent family history.  Social History:  reports that he has never smoked. He has never used smokeless tobacco. He reports that he does not drink alcohol or use drugs.  ROS: Please see flowsheet from today's date for complete review of systems.  Physical Exam: BP 96/63   Pulse 60   Ht 5\' 6"  (1.676 m)   Wt 152 lb 12.8 oz (69.3 kg)   BMI 24.66 kg/m    Constitutional:  Alert and oriented, No acute distress. Cardiovascular: No clubbing, cyanosis, or edema. Respiratory: Normal respiratory effort, no increased work of breathing. GI: Abdomen is soft, nontender, nondistended, no abdominal masses GU: No CVA tenderness, phallus without lesions, widely patent meatus.  Testicles are descended and 20 cc bilaterally, no testicular masses.  There is a small 2 to 3cm superior hematoma in the left scrotum that is tender. Lymph: No cervical or inguinal lymphadenopathy. Skin: No rashes, bruises or suspicious lesions. Neurologic: Grossly intact, no focal deficits, moving  all 4 extremities. Psychiatric: Normal mood and affect.  Laboratory Data: Urinalysis today 6-10 WBCs, 0 RBCs, nitrite negative  Pertinent Imaging: I have personally reviewed the scrotal ultrasound dated 12/14/2017, no intratesticular masses.  2 cm left-sided hematoma adjacent to epididymis.  No testicular fracture.  Assessment & Plan:   In summary, Mr. Daniel Contreras is a healthy 32 year old male who presents for follow-up of a scrotal injury after a significant fall playing soccer.  Scrotal ultrasound demonstrates a small 2 cm scrotal hematoma on the left side, but no intratesticular masses or testicular fracture.  We discussed ongoing conservative management with snug fitting underwear, elevation, icing, rest, and anti-inflammatories.  Follow-up in 1 month with repeat ultrasound to confirm resolution of hematoma  Return in about 4 weeks (around 01/12/2018) for scrotal ultrasound.  Sondra ComeBrian C Mulan Adan, MD  [facility] [address], [address] [place], [place] [address] [phone]

## 2017-12-15 NOTE — Patient Instructions (Signed)
Hematoma escrotal (Scrotal Hematoma) Se denomina hematoma escrotal a la acumulacin de sangre en la bolsa que contiene los testculos (escroto), los vasos sanguneos que suministran la sangre y las estructuras que transportan el esperma y el semen. El abundante suministro de sangre de esa zona facilita que una cantidad significativa de sangre se acumule en el escroto aunque se trate de una lesin mnima o una operacin menor, como una vasectoma. Adems, los tejidos que forman el escroto son muy laxos. No ejercen presin que pueda detener una hemorragia cuando esta se inicia. INSTRUCCIONES PARA EL CUIDADO EN EL HOGAR Controle su hematoma para ver si hay cambios. Las siguientes acciones ayudarn a Paramedicaliviar cualquier Longs Drug Storesmolestia que pueda experimentar:  Aplique hielo sobre la zona lesionada. ? Ponga el hielo en una bolsa plstica. ? Colquese una FirstEnergy Corptoalla entre la piel y la bolsa de hielo. ? Deje el hielo durante 20 minutos, y aplquelo 4 veces por Futures traderda.  Use un sostn para el escroto.  Tome los medicamentos apropiados recetados por su mdico.  Disminuya la actividad fsica y evite todas aquellas que impliquen una presin directa en el escroto y el pene (como andar en bicicleta o montar a caballo).  Evite la actividad sexual hasta que el mdico lo autorice.  Si el mdico le ha dado fecha para una visita de control, es importante que concurra. SOLICITE ATENCIN MDICA SI:  Jabier GaussElimina orina oscura o turbia.  Tiene ganas de orinar con frecuencia.  Observa hinchazn en el escroto que no mejora en 4 das, segn lo esperable. SOLICITE ATENCIN MDICA DE INMEDIATO SI:  Siente un dolor intenso o recurrente que no puede controlar con los medicamentos.  Tiene nuseas o vmitos.  Aumenta la hinchazn del escroto.  Siente cada vez ms dolor abdominal.  Siente escalofros o fiebre.  Tiene dificultad para comenzar a Geographical information systems officerorinar.  Siente dolor al ConocoPhillipsorinar.  La orina fluye lentamente.  Hay sangre en la  orina.  No puede orinar.  Observa una inflamacin que se extiende desde el escroto hacia la ingle o los muslos. ASEGRESE DE QUE:  Comprende estas instrucciones.  Controlar su afeccin.  Recibir ayuda de inmediato si no mejora o si empeora. Esta informacin no tiene Theme park managercomo fin reemplazar el consejo del mdico. Asegrese de hacerle al mdico cualquier pregunta que tenga. Document Released: 02/28/2008 Document Revised: 11/17/2012 Document Reviewed: 08/19/2012 Elsevier Interactive Patient Education  [employer]2018 Elsevier Inc.

## 2017-12-21 ENCOUNTER — Emergency Department
Admission: EM | Admit: 2017-12-21 | Discharge: 2017-12-21 | Disposition: A | Discharge status: Home / Self Care | Payer: Self-pay | Attending: Emergency Medicine | Admitting: Emergency Medicine

## 2017-12-21 ENCOUNTER — Emergency Department: Payer: Self-pay

## 2017-12-21 ENCOUNTER — Encounter: Payer: Self-pay | Admitting: Emergency Medicine

## 2017-12-21 DIAGNOSIS — R102 Pelvic and perineal pain: Secondary | ICD-10-CM | POA: Insufficient documentation

## 2017-12-21 DIAGNOSIS — S3022XD Contusion of scrotum and testes, subsequent encounter: Secondary | ICD-10-CM | POA: Insufficient documentation

## 2017-12-21 DIAGNOSIS — X58XXXD Exposure to other specified factors, subsequent encounter: Secondary | ICD-10-CM | POA: Insufficient documentation

## 2017-12-21 DIAGNOSIS — Z79899 Other long term (current) drug therapy: Secondary | ICD-10-CM | POA: Insufficient documentation

## 2017-12-21 DIAGNOSIS — N50819 Testicular pain, unspecified: Secondary | ICD-10-CM

## 2017-12-21 LAB — URINALYSIS, COMPLETE (UACMP) WITH MICROSCOPIC
BILIRUBIN URINE: NEGATIVE
Bacteria, UA: NONE SEEN
Glucose, UA: NEGATIVE mg/dL
Hgb urine dipstick: NEGATIVE
KETONES UR: NEGATIVE mg/dL
Leukocytes, UA: NEGATIVE
Nitrite: NEGATIVE
Protein, ur: NEGATIVE mg/dL
Specific Gravity, Urine: 1.025 (ref 1.005–1.030)
pH: 5 (ref 5.0–8.0)

## 2017-12-21 MED ORDER — TRAMADOL HCL 50 MG PO TABS: 50.0000 mg | ORAL_TABLET | Freq: Four times a day (QID) | ORAL | 0 refills | Status: AC | PRN

## 2017-12-21 MED ORDER — OXYCODONE-ACETAMINOPHEN 5-325 MG PO TABS
1.0000 | ORAL_TABLET | Freq: Once | ORAL | Status: AC
  Administered 2017-12-21: 1 via ORAL
  Filled 2017-12-21: qty 1

## 2017-12-21 NOTE — ED Triage Notes (Signed)
Patient states that he has testicular pain times one week. Patient states that he was seen here and had an ultrasound and was told to follow up with urology. Patient sates that he was seen by the urologist and told that they would repeat the ultra sound in 4 weeks. Patient reports that the pain has become worse.

## 2017-12-21 NOTE — ED Notes (Signed)
Pt back to waiting room from US;

## 2017-12-21 NOTE — ED Notes (Signed)
Pt laying down on wide chair in waiting room, eyes closed, resp even and unlabored

## 2017-12-21 NOTE — ED Provider Notes (Signed)
[facility] Emergency Department Provider Note    First MD Initiated Contact with Patient 12/21/17 0451     (approximate)  I have reviewed the triage vital signs and the nursing notes.   HISTORY  Chief Complaint Testicle Pain    HPI Daniel Contreras is a 32 y.o. male with below list of chronic medical conditions including history of traumatic left scrotal hematoma secondary which patient sustained while playing soccer on 12/14/2017.  Patient was subsequently seen by Dr.Sninsky on 12/15/2017 with recommendation for follow-up for repeat ultrasound in 1 month.  Patient now presents to the emergency department with worsening scrotal discomfort over the past 3 days.  Patient denies any fever.  Patient denies any nausea or vomiting.  Patient also admits to low pelvic discomfort as well.  She states current pain score is 9 out of 10  Past Medical History:  Diagnosis Date  . Seizures [facility])     Patient Active Problem List   Diagnosis Date Noted  . Scrotal injury, initial encounter 12/15/2017    History reviewed. No pertinent surgical history.  Prior to Admission medications   Medication Sig Start Date End Date Taking? Authorizing Provider  diazepam (VALIUM) 5 MG tablet Take 1 tablet (5 mg total) by mouth every 8 (eight) hours as needed for muscle spasms. 08/12/17   Emily Filbert, MD  diphenhydrAMINE (BENYLIN) 12.5 MG/5ML syrup Take 5 mLs (12.5 mg total) by mouth 4 (four) times daily as needed for allergies. 07/01/17   Joni Reining, PA-C  famotidine (PEPCID) 20 MG tablet Take 1 tablet (20 mg total) by mouth 2 (two) times daily. 04/04/17 04/04/18  Merrily Brittle, MD  famotidine (PEPCID) 20 MG tablet Take 1 tablet (20 mg total) by mouth 2 (two) times daily. 08/12/17   Emily Filbert, MD  fexofenadine-pseudoephedrine (ALLEGRA-D) 60-120 MG 12 hr tablet Take 1 tablet by mouth 2 (two) times daily. 07/01/17   Joni Reining, PA-C  gentamicin (GARAMYCIN) 0.3 %  ophthalmic solution Place 1 drop into the left eye every 4 (four) hours. 10/13/16   Joni Reining, PA-C  levETIRAcetam (KEPPRA) 500 MG tablet Take 1 tablet (500 mg total) by mouth 2 (two) times daily. 08/18/15   Emily Filbert, MD  lidocaine (XYLOCAINE) 2 % solution Use as directed 5 mLs in the mouth or throat every 6 (six) hours as needed for mouth pain. Mix with 5 mL of Benadryl for swish and swallow. 07/01/17   Joni Reining, PA-C  loperamide (IMODIUM A-D) 2 MG tablet Take 1 tablet (2 mg total) by mouth 4 (four) times daily as needed for diarrhea or loose stools. 04/15/16   Cuthriell, Delorise Royals, PA-C  magic mouthwash SOLN Take 5 mLs by mouth 3 (three) times daily as needed for mouth pain. 03/25/17   Irean Hong, MD  methylPREDNISolone (MEDROL DOSEPAK) 4 MG TBPK tablet Take Tapered dose as directed 07/01/17   Joni Reining, PA-C  naproxen (NAPROSYN) 500 MG tablet Take 1 tablet (500 mg total) by mouth 2 (two) times daily with a meal. 10/13/16   Joni Reining, PA-C  naproxen (NAPROSYN) 500 MG tablet Take 1 tablet (500 mg total) by mouth 2 (two) times daily with a meal. 12/14/17   Joni Reining, PA-C  ondansetron (ZOFRAN-ODT) 4 MG disintegrating tablet Take 1 tablet (4 mg total) by mouth every 8 (eight) hours as needed for nausea or vomiting. 04/15/16   Cuthriell, Delorise Royals, PA-C  oxyCODONE-acetaminophen (PERCOCET) 7.5-325  MG tablet Take 1 tablet by mouth every 6 (six) hours as needed. 12/14/17   Joni Reining, PA-C  traMADol (ULTRAM) 50 MG tablet Take 1 tablet (50 mg total) by mouth every 6 (six) hours as needed. 12/21/17 12/21/18  Darci Current, MD    Allergies Patient has no known allergies.  No family history on file.  Social History Social History   Tobacco Use  . Smoking status: Never Smoker  . Smokeless tobacco: Never Used  Substance Use Topics  . Alcohol use: No  . Drug use: No    Review of Systems Constitutional: No fever/chills Eyes: No visual changes. ENT: No  sore throat. Cardiovascular: Denies chest pain. Respiratory: Denies shortness of breath. Gastrointestinal: No abdominal pain.  No nausea, no vomiting.  No diarrhea.  No constipation. Genitourinary: Negative for dysuria.  Positive for scrotal pain Musculoskeletal: Negative for neck pain.  Negative for back pain. Integumentary: Negative for rash. Neurological: Negative for headaches, focal weakness or numbness.  ____________________________________________   PHYSICAL EXAM:  VITAL SIGNS: ED Triage Vitals [12/21/17 0132]  Enc Vitals Group     BP 128/66     Pulse Rate (!) 56     Resp 16     Temp 97.9 F (36.6 C)     Temp Source Oral     SpO2 100 %     Weight 69.3 kg (152 lb 12.5 oz)     Height 1.676 m (5\' 6" )     Head Circumference      Peak Flow      Pain Score 8     Pain Loc      Pain Edu?      Excl. in GC?     Constitutional: Alert and oriented. Well appearing and in no acute distress. Eyes: Conjunctivae are normal. Mouth/Throat: Mucous membranes are moist.  Oropharynx non-erythematous. Neck: No stridor.  Cardiovascular: Normal rate, regular rhythm. Good peripheral circulation. Grossly normal heart sounds. Respiratory: Normal respiratory effort.  No retractions. Lungs CTAB. Gastrointestinal: Bilateral lower quadrant tenderness to palpation.  No distention. Genitourinary: No overlying ecchymoses noted on the scrotum however left scrotal pain with gentle palpation. Musculoskeletal: No lower extremity tenderness nor edema. No gross deformities of extremities. Neurologic:  Normal speech and language. No gross focal neurologic deficits are appreciated.  Skin:  Skin is warm, dry and intact. No rash noted. Psychiatric: Mood and affect are normal. Speech and behavior are normal.  ____________________________________________   LABS (all labs ordered are listed, but only abnormal results are displayed)  Labs Reviewed  URINALYSIS, COMPLETE (UACMP) WITH MICROSCOPIC - Abnormal;  Notable for the following components:      Result Value   Color, Urine YELLOW (*)    APPearance CLEAR (*)    All other components within normal limits   _____________________________________ RADIOLOGY I, Lorena N BROWN, personally viewed and evaluated these images (plain radiographs) as part of my medical decision making, as well as reviewing the written report by the radiologist.  ED MD interpretation: Left scrotal hematoma again noted on ultrasound of the scrotum.  CT scan of the M pelvis revealed no acute intra-abdominal or pelvic pathology.  Official radiology report(s): US Scrotum  Result Date: 12/21/2017 CLINICAL DATA:  32 year old male with left testicular pain after recent fall. EXAM: SCROTAL ULTRASOUND DOPPLER ULTRASOUND OF THE TESTICLES TECHNIQUE: Complete ultrasound examination of the testicles, epididymis, and other scrotal structures was performed. Color and spectral Doppler ultrasound were also utilized to evaluate blood flow to the testicles. COMPARISON:  Testicular ultrasound dated 12/14/2017 FINDINGS: Right testicle Measurements: 4.4 x 1.9 x 2.6 cm. No mass or microlithiasis visualized. Left testicle Measurements: 3.9 x 2.1 x 2.4 cm. No mass or microlithiasis visualized. Right epididymis:  Normal in size and appearance. Left epididymis: Normal in size and appearance. There is a 1.7 x 1.6 x 1.1 cm (previously measuring 1.9 x 1.4 cm) ovoid hypoechoic lesion with slight internal vascularity adjacent to the left epididymis. This is not well characterized. Although this may represent a hematoma other etiologies are not excluded. Continue close follow-up with repeat ultrasound in 1-2 weeks recommended to document resolution. If this lesion persists on the follow-up ultrasound, further evaluation with MRI is recommended. Hydrocele:  None visualized. Varicocele:  None visualized. Pulsed Doppler interrogation of both testes demonstrates normal low resistance arterial and venous waveforms  bilaterally. IMPRESSION: 1. Ovoid hypoechoic structure adjacent to the left epididymis may represent a hematoma although other etiologies are not excluded. Follow-up with ultrasound in 1-2 weeks recommended to document resolution. If this lesion persists on the follow-up ultrasound study, further evaluation with MRI is advised. 2. Unremarkable testicles. Electronically Signed   By: Elgie Collard M.D.   On: 12/21/2017 03:43   US Pelvic Doppler (torsion R/o Or Mass Arterial Flow)  Result Date: 12/21/2017 CLINICAL DATA:  32 year old male with left testicular pain after recent fall. EXAM: SCROTAL ULTRASOUND DOPPLER ULTRASOUND OF THE TESTICLES TECHNIQUE: Complete ultrasound examination of the testicles, epididymis, and other scrotal structures was performed. Color and spectral Doppler ultrasound were also utilized to evaluate blood flow to the testicles. COMPARISON:  Testicular ultrasound dated 12/14/2017 FINDINGS: Right testicle Measurements: 4.4 x 1.9 x 2.6 cm. No mass or microlithiasis visualized. Left testicle Measurements: 3.9 x 2.1 x 2.4 cm. No mass or microlithiasis visualized. Right epididymis:  Normal in size and appearance. Left epididymis: Normal in size and appearance. There is a 1.7 x 1.6 x 1.1 cm (previously measuring 1.9 x 1.4 cm) ovoid hypoechoic lesion with slight internal vascularity adjacent to the left epididymis. This is not well characterized. Although this may represent a hematoma other etiologies are not excluded. Continue close follow-up with repeat ultrasound in 1-2 weeks recommended to document resolution. If this lesion persists on the follow-up ultrasound, further evaluation with MRI is recommended. Hydrocele:  None visualized. Varicocele:  None visualized. Pulsed Doppler interrogation of both testes demonstrates normal low resistance arterial and venous waveforms bilaterally. IMPRESSION: 1. Ovoid hypoechoic structure adjacent to the left epididymis may represent a hematoma although  other etiologies are not excluded. Follow-up with ultrasound in 1-2 weeks recommended to document resolution. If this lesion persists on the follow-up ultrasound study, further evaluation with MRI is advised. 2. Unremarkable testicles. Electronically Signed   By: Elgie Collard M.D.   On: 12/21/2017 03:43   Ct Renal Stone Study  Result Date: 12/21/2017 CLINICAL DATA:  32 year old male with hematuria.  Testicular pain. EXAM: CT ABDOMEN AND PELVIS WITHOUT CONTRAST TECHNIQUE: Multidetector CT imaging of the abdomen and pelvis was performed following the standard protocol without IV contrast. COMPARISON:  Testicular ultrasound dated 12/21/2017 FINDINGS: Evaluation of this exam is limited in the absence of intravenous contrast. Lower chest: The visualized lung bases are clear. No intra-abdominal free air or free fluid. Hepatobiliary: No focal liver abnormality is seen. No gallstones, gallbladder wall thickening, or biliary dilatation. Pancreas: Unremarkable. No pancreatic ductal dilatation or surrounding inflammatory changes. Spleen: Normal in size without focal abnormality. Adrenals/Urinary Tract: Adrenal glands are unremarkable. Kidneys are normal, without renal calculi, focal lesion, or  hydronephrosis. Bladder is unremarkable. Stomach/Bowel: Stomach is within normal limits. Appendix appears normal. No evidence of bowel wall thickening, distention, or inflammatory changes. Vascular/Lymphatic: The abdominal aorta and IVC are grossly unremarkable on this noncontrast CT. No portal venous gas. There is no adenopathy. Reproductive: The prostate and seminal vesicles are grossly unremarkable. No pelvic mass. Other: None Musculoskeletal: No acute or significant osseous findings. IMPRESSION: No acute intra-abdominopelvic pathology. Specifically no hydronephrosis or nephrolithiasis. Electronically Signed   By: Elgie CollardArash  Radparvar M.D.   On: 12/21/2017 05:41    ____________________________________________       Procedures   ____________________________________________   INITIAL IMPRESSION / ASSESSMENT AND PLAN / ED COURSE  As part of my medical decision making, I reviewed the following data within the electronic MEDICAL RECORD NUMBER   32-year-old male presenting to the emergency department above-stated history and physical exam with ultrasound of the scrotum consistent with previous finding of scrotal hematoma.  CT scan of the abdomen pelvis revealed no acute intra-abdominal or pelvic pathology.  Patient will be prescribed tramadol for home with recommendation to follow-up with urologist as planned. ____________________________________________  FINAL CLINICAL IMPRESSION(S) / ED DIAGNOSES  Final diagnoses:  Traumatic scrotal hematoma, subsequent encounter     MEDICATIONS GIVEN DURING THIS VISIT:  Medications  oxyCODONE-acetaminophen (PERCOCET/ROXICET) 5-325 MG per tablet 1 tablet (1 tablet Oral Given 12/21/17 0515)     ED Discharge Orders         Ordered    traMADol (ULTRAM) 50 MG tablet  Every 6 hours PRN     12/21/17 0631           Note:  This document was prepared using Dragon voice recognition software and may include unintentional dictation errors.    Darci CurrentBrown, [place] N, MD 12/21/17 [phone]

## 2018-01-12 ENCOUNTER — Ambulatory Visit: Payer: Self-pay | Admitting: Urology

## 2018-01-14 ENCOUNTER — Ambulatory Visit: Admission: RE | Admit: 2018-01-14 | Payer: Self-pay | Source: Ambulatory Visit

## 2018-01-14 ENCOUNTER — Telehealth: Payer: Self-pay | Admitting: Urology

## 2018-01-14 NOTE — Telephone Encounter (Signed)
Pt didn't show up for testicular U/S today just F.Y.I.

## 2018-02-02 ENCOUNTER — Ambulatory Visit: Payer: Self-pay | Admitting: Urology

## 2018-02-03 ENCOUNTER — Encounter: Payer: Self-pay | Admitting: Urology

## 2018-09-17 DIAGNOSIS — Z79899 Other long term (current) drug therapy: Secondary | ICD-10-CM | POA: Insufficient documentation

## 2018-09-17 DIAGNOSIS — R0789 Other chest pain: Secondary | ICD-10-CM | POA: Insufficient documentation

## 2018-09-17 NOTE — ED Notes (Signed)
Patient to waiting room via wheelchair by EMS.  Per EMS patient was anxious and laid down on floor at home, when he would respond to family they performed CPR on him (unknown length of time).  EMS reports patient feeling numb all over.  12-lead showing sinus rhythm, BP- 122/76, HR -  82,  pulse oxi 97% on room air,  cap 26, rr 18.

## 2018-09-18 ENCOUNTER — Emergency Department
Admission: EM | Admit: 2018-09-18 | Discharge: 2018-09-18 | Disposition: A | Discharge status: Home / Self Care | Payer: Self-pay | Attending: Emergency Medicine | Admitting: Emergency Medicine

## 2018-09-18 ENCOUNTER — Emergency Department: Payer: Self-pay

## 2018-09-18 ENCOUNTER — Other Ambulatory Visit: Payer: Self-pay

## 2018-09-18 ENCOUNTER — Encounter: Payer: Self-pay | Admitting: Emergency Medicine

## 2018-09-18 DIAGNOSIS — R079 Chest pain, unspecified: Secondary | ICD-10-CM

## 2018-09-18 DIAGNOSIS — R0789 Other chest pain: Secondary | ICD-10-CM

## 2018-09-18 LAB — BASIC METABOLIC PANEL
Anion gap: 9 (ref 5–15)
BUN: 26 mg/dL — ABNORMAL HIGH (ref 6–20)
CO2: 24 mmol/L (ref 22–32)
Calcium: 8.8 mg/dL — ABNORMAL LOW (ref 8.9–10.3)
Chloride: 106 mmol/L (ref 98–111)
Creatinine, Ser: 0.92 mg/dL (ref 0.61–1.24)
GFR calc Af Amer: >60 mL/min (ref >60)
GFR calc non Af Amer: >60 mL/min (ref >60)
Glucose, Bld: 127 mg/dL — ABNORMAL HIGH (ref 70–99)
Potassium: 2.9 mmol/L — ABNORMAL LOW (ref 3.5–5.1)
Sodium: 139 mmol/L (ref 135–145)

## 2018-09-18 LAB — CBC
HCT: 44.6 % (ref 39.0–52.0)
Hemoglobin: 15.4 g/dL (ref 13.0–17.0)
MCH: 31.2 pg (ref 26.0–34.0)
MCHC: 34.5 g/dL (ref 30.0–36.0)
MCV: 90.3 fL (ref 80.0–100.0)
Platelets: 199 10*3/uL (ref 150–400)
RBC: 4.94 MIL/uL (ref 4.22–5.81)
RDW: 12.9 % (ref 11.5–15.5)
WBC: 6.1 10*3/uL (ref 4.0–10.5)
nRBC: 0 % (ref 0.0–0.2)

## 2018-09-18 LAB — TROPONIN I: Troponin I: <0.03 ng/mL (ref <0.03)

## 2018-09-18 NOTE — ED Notes (Signed)
Interpreter number [phone] used for discharge and registration

## 2018-09-18 NOTE — ED Provider Notes (Signed)
[facility] Emergency Department Provider Note  Time seen: 8:23 AM  I have reviewed the triage vital signs and the nursing notes.   HISTORY  Chief Complaint Chest Pain   HPI Daniel Contreras is a 33 y.o. male with a past medical history of seizures presents emergency department for left-sided chest pain.  According to the patient around 10 PM last night while washing dishes he developed some pain in the left chest.  Patient states the pain is worse with movement of the left arm or pushing on the chest.  Denies any recent fever cough or shortness of breath.  Overall the patient appears well, he has had a very long stay in the waiting room approximately 9 hours at this time.   Past Medical History:  Diagnosis Date  . Seizures [facility](HCC)     Patient Active Problem List   Diagnosis Date Noted  . Scrotal injury, initial encounter 12/15/2017    History reviewed. No pertinent surgical history.  Prior to Admission medications   Medication Sig Start Date End Date Taking? Authorizing Provider  diazepam (VALIUM) 5 MG tablet Take 1 tablet (5 mg total) by mouth every 8 (eight) hours as needed for muscle spasms. 08/12/17   Emily FilbertWilliams, Jonathan E, MD  diphenhydrAMINE (BENYLIN) 12.5 MG/5ML syrup Take 5 mLs (12.5 mg total) by mouth 4 (four) times daily as needed for allergies. 07/01/17   Joni ReiningSmith, Ronald K, PA-C  famotidine (PEPCID) 20 MG tablet Take 1 tablet (20 mg total) by mouth 2 (two) times daily. 04/04/17 04/04/18  Merrily Brittleifenbark, Neil, MD  famotidine (PEPCID) 20 MG tablet Take 1 tablet (20 mg total) by mouth 2 (two) times daily. 08/12/17   Emily FilbertWilliams, Jonathan E, MD  fexofenadine-pseudoephedrine (ALLEGRA-D) 60-120 MG 12 hr tablet Take 1 tablet by mouth 2 (two) times daily. 07/01/17   Joni ReiningSmith, Ronald K, PA-C  gentamicin (GARAMYCIN) 0.3 % ophthalmic solution Place 1 drop into the left eye every 4 (four) hours. 10/13/16   Joni ReiningSmith, Ronald K, PA-C  levETIRAcetam (KEPPRA) 500 MG tablet Take 1 tablet  (500 mg total) by mouth 2 (two) times daily. 08/18/15   Emily FilbertWilliams, Jonathan E, MD  lidocaine (XYLOCAINE) 2 % solution Use as directed 5 mLs in the mouth or throat every 6 (six) hours as needed for mouth pain. Mix with 5 mL of Benadryl for swish and swallow. 07/01/17   Joni ReiningSmith, Ronald K, PA-C  loperamide (IMODIUM A-D) 2 MG tablet Take 1 tablet (2 mg total) by mouth 4 (four) times daily as needed for diarrhea or loose stools. 04/15/16   Cuthriell, Delorise RoyalsJonathan D, PA-C  magic mouthwash SOLN Take 5 mLs by mouth 3 (three) times daily as needed for mouth pain. 03/25/17   Irean HongSung, Jade J, MD  methylPREDNISolone (MEDROL DOSEPAK) 4 MG TBPK tablet Take Tapered dose as directed 07/01/17   Joni ReiningSmith, Ronald K, PA-C  naproxen (NAPROSYN) 500 MG tablet Take 1 tablet (500 mg total) by mouth 2 (two) times daily with a meal. 10/13/16   Joni ReiningSmith, Ronald K, PA-C  naproxen (NAPROSYN) 500 MG tablet Take 1 tablet (500 mg total) by mouth 2 (two) times daily with a meal. 12/14/17   Joni ReiningSmith, Ronald K, PA-C  ondansetron (ZOFRAN-ODT) 4 MG disintegrating tablet Take 1 tablet (4 mg total) by mouth every 8 (eight) hours as needed for nausea or vomiting. 04/15/16   Cuthriell, Delorise RoyalsJonathan D, PA-C  oxyCODONE-acetaminophen (PERCOCET) 7.5-325 MG tablet Take 1 tablet by mouth every 6 (six) hours as needed. 12/14/17   Nona DellSmith, Ronald  K, PA-C  traMADol (ULTRAM) 50 MG tablet Take 1 tablet (50 mg total) by mouth every 6 (six) hours as needed. 12/21/17 12/21/18  Darci CurrentBrown, [place] N, MD    No Known Allergies  No family history on file.  Social History Social History   Tobacco Use  . Smoking status: Never Smoker  . Smokeless tobacco: Never Used  Substance Use Topics  . Alcohol use: No  . Drug use: No    Review of Systems Constitutional: Negative for fever. Cardiovascular: Mild left chest pain worse with movement Respiratory: Negative for shortness of breath. Gastrointestinal: Negative for abdominal pain, vomiting Musculoskeletal: Left chest wall pain Skin:  Negative for skin complaints  Neurological: Negative for headache All other ROS negative  ____________________________________________   PHYSICAL EXAM:  VITAL SIGNS: ED Triage Vitals  Enc Vitals Group     BP 09/17/18 2346 125/69     Pulse Rate 09/17/18 2346 66     Resp 09/17/18 2346 18     Temp 09/17/18 2346 97.9 F (36.6 C)     Temp Source 09/17/18 2346 Oral     SpO2 09/17/18 2346 98 %     Weight 09/17/18 2346 176 lb (79.8 kg)     Height 09/17/18 2346 5\' 4"  (1.626 m)     Head Circumference --      Peak Flow --      Pain Score 09/18/18 0006 5     Pain Loc --      Pain Edu? --      Excl. in GC? --    Constitutional: Alert and oriented. Well appearing and in no distress. Eyes: Normal exam ENT      Head: Normocephalic and atraumatic      Mouth/Throat: Mucous membranes are moist. Cardiovascular: Normal rate, regular rhythm.  Respiratory: Normal respiratory effort without tachypnea nor retractions. Breath sounds are clear Gastrointestinal: Soft and nontender. No distention.  Musculoskeletal: Mild left chest tenderness palpation. Neurologic:  Normal speech and language. No gross focal neurologic deficits  Skin:  Skin is warm, dry and intact.  Psychiatric: Mood and affect are normal.  ____________________________________________    EKG  EKG viewed and interpreted by myself shows a normal sinus rhythm at 68 bpm with a narrow QRS, normal axis, normal intervals, no concerning ST changes.  ____________________________________________    RADIOLOGY  Chest x-ray is negative  ____________________________________________   INITIAL IMPRESSION / ASSESSMENT AND PLAN / ED COURSE  Pertinent labs & imaging results that were available during my care of the patient were reviewed by me and considered in my medical decision making (see chart for details).   Patient presents emergency department for left chest pain starting around 10 PM last night.  Overall the patient appears well,  no acute distress.  Patient states he was washing dishes when the pain started.  Pain is reproducible with palpation or movement of the left upper extremity.  Patient's work-up including cardiac enzymes are negative/reassuring, chest x-ray is normal and EKG is normal.  Overall the patient appears well, highly suspect musculoskeletal discomfort causing the patient's pain.  I discussed my normal chest pain return precautions as well as rest and ibuprofen as needed.  Patient agreeable to plan of care.  Alezander Vilinda FlakeJaimes Contreras was evaluated in Emergency Department on 09/18/2018 for the symptoms described in the history of present illness. He was evaluated in the context of the global COVID-19 pandemic, which necessitated consideration that the patient might be at risk for infection with the SARS-CoV-2 virus  that causes COVID-19. Institutional protocols and algorithms that pertain to the evaluation of patients at risk for COVID-19 are in a state of rapid change based on information released by regulatory bodies including the CDC and federal and state organizations. These policies and algorithms were followed during the patient's care in the ED.  ____________________________________________   FINAL CLINICAL IMPRESSION(S) / ED DIAGNOSES  Left chest pain   Harvest Dark, MD 09/18/18 [phone]

## 2018-09-18 NOTE — ED Notes (Signed)
Interpreter request submitted. 

## 2018-09-18 NOTE — ED Triage Notes (Addendum)
Patient came in by ems from home. Patient with complaint of left side chest pain that started tonight around 22:30. Patient with complaint of shortness of breath. Denies nausea or vomiting. Patient states that for a moment he felt like he was not conscious. Patient states that the pain is worse when he takes a deep breath. Patient triaged using video interpretor Verdis Frederickson [phone].

## 2019-02-14 ENCOUNTER — Emergency Department: Payer: Self-pay

## 2019-02-14 ENCOUNTER — Other Ambulatory Visit: Payer: Self-pay

## 2019-02-14 ENCOUNTER — Observation Stay
Admission: EM | Admit: 2019-02-14 | Discharge: 2019-02-15 | Disposition: A | Discharge status: Home / Self Care | Payer: Self-pay | Attending: Internal Medicine | Admitting: Internal Medicine

## 2019-02-14 ENCOUNTER — Encounter: Payer: Self-pay | Admitting: Emergency Medicine

## 2019-02-14 DIAGNOSIS — G40919 Epilepsy, unspecified, intractable, without status epilepticus: Principal | ICD-10-CM | POA: Insufficient documentation

## 2019-02-14 DIAGNOSIS — R569 Unspecified convulsions: Secondary | ICD-10-CM

## 2019-02-14 DIAGNOSIS — W1839XA Other fall on same level, initial encounter: Secondary | ICD-10-CM | POA: Insufficient documentation

## 2019-02-14 DIAGNOSIS — Z791 Long term (current) use of non-steroidal anti-inflammatories (NSAID): Secondary | ICD-10-CM | POA: Insufficient documentation

## 2019-02-14 DIAGNOSIS — S098XXA Other specified injuries of head, initial encounter: Secondary | ICD-10-CM | POA: Insufficient documentation

## 2019-02-14 DIAGNOSIS — Z9114 Patient's other noncompliance with medication regimen: Secondary | ICD-10-CM | POA: Insufficient documentation

## 2019-02-14 DIAGNOSIS — Y92019 Unspecified place in single-family (private) house as the place of occurrence of the external cause: Secondary | ICD-10-CM | POA: Insufficient documentation

## 2019-02-14 DIAGNOSIS — Z20828 Contact with and (suspected) exposure to other viral communicable diseases: Secondary | ICD-10-CM | POA: Insufficient documentation

## 2019-02-14 LAB — CBC WITH DIFFERENTIAL/PLATELET
Abs Immature Granulocytes: 0.03 10*3/uL (ref 0.00–0.07)
Basophils Absolute: 0 10*3/uL (ref 0.0–0.1)
Basophils Relative: 1 %
Eosinophils Absolute: 0.1 10*3/uL (ref 0.0–0.5)
Eosinophils Relative: 1 %
HCT: 41.8 % (ref 39.0–52.0)
Hemoglobin: 14.2 g/dL (ref 13.0–17.0)
Immature Granulocytes: 0 %
Lymphocytes Relative: 25 %
Lymphs Abs: 2 10*3/uL (ref 0.7–4.0)
MCH: 30.6 pg (ref 26.0–34.0)
MCHC: 34 g/dL (ref 30.0–36.0)
MCV: 90.1 fL (ref 80.0–100.0)
Monocytes Absolute: 0.9 10*3/uL (ref 0.1–1.0)
Monocytes Relative: 11 %
Neutro Abs: 5.1 10*3/uL (ref 1.7–7.7)
Neutrophils Relative %: 62 %
Platelets: 223 10*3/uL (ref 150–400)
RBC: 4.64 MIL/uL (ref 4.22–5.81)
RDW: 12.8 % (ref 11.5–15.5)
WBC: 8.1 10*3/uL (ref 4.0–10.5)
nRBC: 0 % (ref 0.0–0.2)

## 2019-02-14 LAB — BASIC METABOLIC PANEL
Anion gap: 13 (ref 5–15)
BUN: 23 mg/dL — ABNORMAL HIGH (ref 6–20)
CO2: 24 mmol/L (ref 22–32)
Calcium: 9.1 mg/dL (ref 8.9–10.3)
Chloride: 103 mmol/L (ref 98–111)
Creatinine, Ser: 1.09 mg/dL (ref 0.61–1.24)
GFR calc Af Amer: >60 mL/min (ref >60)
GFR calc non Af Amer: >60 mL/min (ref >60)
Glucose, Bld: 113 mg/dL — ABNORMAL HIGH (ref 70–99)
Potassium: 3.5 mmol/L (ref 3.5–5.1)
Sodium: 140 mmol/L (ref 135–145)

## 2019-02-14 LAB — ETHANOL: Alcohol, Ethyl (B): <10 mg/dL (ref <10)

## 2019-02-14 MED ORDER — LEVETIRACETAM IN NACL 1000 MG/100ML IV SOLN
1000.0000 mg | Freq: Once | INTRAVENOUS | Status: AC
  Administered 2019-02-14: 1000 mg via INTRAVENOUS
  Filled 2019-02-14: qty 100

## 2019-02-14 NOTE — ED Triage Notes (Signed)
Patient arrives from home with seizures; reported patient had seizure for 10 minutes before EMS arrived- had fallen and hit head. Patient has history of seizures, was taken off Keppra in 2011. Family/patient deny drug use, alcohol. EMs gave 2mg  IM versed. Patient not currently seizing on arrival

## 2019-02-14 NOTE — ED Provider Notes (Signed)
[facility] Emergency Department Provider Note  ______________________________________   I have reviewed the triage vital signs and the nursing notes.   HISTORY  Chief Complaint Seizures   History limited by and level 5 caveat due to: Post ictal   HPI Daniel Contreras is a 33 y.o. male who presents to the emergency department today because of concern for seizure. The patient has history of seizures although per EMS report is not currently on any medication. Per report the patient was in his house when family noticed a seizure. It had apparently been going on for roughly 10 minutes by the time EMS arrived. They gave 2 mg versed IM and the seizure continued for another 10-15 minutes before stopping. EMS describe genarilized tonic clonic seizure activity. Patient is opening eyes however non verbal on exam.   Records reviewed. Per medical record review patient has a history of seizures, seen in 2017 in the ED after apparent seizure like activity.   Past Medical History:  Diagnosis Date  . Seizures [facility])     Patient Active Problem List   Diagnosis Date Noted  . Scrotal injury, initial encounter 12/15/2017    History reviewed. No pertinent surgical history.  Prior to Admission medications   Medication Sig Start Date End Date Taking? Authorizing Provider  diazepam (VALIUM) 5 MG tablet Take 1 tablet (5 mg total) by mouth every 8 (eight) hours as needed for muscle spasms. 08/12/17   Earleen Newport, MD  diphenhydrAMINE (BENYLIN) 12.5 MG/5ML syrup Take 5 mLs (12.5 mg total) by mouth 4 (four) times daily as needed for allergies. 07/01/17   Sable Feil, PA-C  famotidine (PEPCID) 20 MG tablet Take 1 tablet (20 mg total) by mouth 2 (two) times daily. 04/04/17 04/04/18  Darel Hong, MD  famotidine (PEPCID) 20 MG tablet Take 1 tablet (20 mg total) by mouth 2 (two) times daily. 08/12/17   Earleen Newport, MD  fexofenadine-pseudoephedrine (ALLEGRA-D) 60-120 MG  12 hr tablet Take 1 tablet by mouth 2 (two) times daily. 07/01/17   Sable Feil, PA-C  gentamicin (GARAMYCIN) 0.3 % ophthalmic solution Place 1 drop into the left eye every 4 (four) hours. 10/13/16   Sable Feil, PA-C  levETIRAcetam (KEPPRA) 500 MG tablet Take 1 tablet (500 mg total) by mouth 2 (two) times daily. 08/18/15   Earleen Newport, MD  lidocaine (XYLOCAINE) 2 % solution Use as directed 5 mLs in the mouth or throat every 6 (six) hours as needed for mouth pain. Mix with 5 mL of Benadryl for swish and swallow. 07/01/17   Sable Feil, PA-C  loperamide (IMODIUM A-D) 2 MG tablet Take 1 tablet (2 mg total) by mouth 4 (four) times daily as needed for diarrhea or loose stools. 04/15/16   Cuthriell, Charline Bills, PA-C  magic mouthwash SOLN Take 5 mLs by mouth 3 (three) times daily as needed for mouth pain. 03/25/17   Paulette Blanch, MD  methylPREDNISolone (MEDROL DOSEPAK) 4 MG TBPK tablet Take Tapered dose as directed 07/01/17   Sable Feil, PA-C  naproxen (NAPROSYN) 500 MG tablet Take 1 tablet (500 mg total) by mouth 2 (two) times daily with a meal. 10/13/16   Sable Feil, PA-C  naproxen (NAPROSYN) 500 MG tablet Take 1 tablet (500 mg total) by mouth 2 (two) times daily with a meal. 12/14/17   Sable Feil, PA-C  ondansetron (ZOFRAN-ODT) 4 MG disintegrating tablet Take 1 tablet (4 mg total) by mouth every 8 (  eight) hours as needed for nausea or vomiting. 04/15/16   Cuthriell, Delorise Royals, PA-C  oxyCODONE-acetaminophen (PERCOCET) 7.5-325 MG tablet Take 1 tablet by mouth every 6 (six) hours as needed. 12/14/17   Joni Reining, PA-C    Allergies Patient has no known allergies.  History reviewed. No pertinent family history.  Social History Social History   Tobacco Use  . Smoking status: Never Smoker  . Smokeless tobacco: Never Used  Substance Use Topics  . Alcohol use: No  . Drug use: No    Review of Systems Unable to obtain.   ____________________________________________   PHYSICAL EXAM:  VITAL SIGNS: ED Triage Vitals  Enc Vitals Group     BP 02/14/19 1937 128/76     Pulse Rate 02/14/19 1937 69     Resp 02/14/19 1937 16     Temp 02/14/19 1937 98.6 F (37 C)     Temp Source 02/14/19 1937 Oral     SpO2 02/14/19 1937 100 %     Weight 02/14/19 1938 176 lb 5.9 oz (80 kg)     Height 02/14/19 1938 5\' 9"  (1.753 m)   Constitutional: Somnolent. Opens eyes to verbal stimuli Eyes: Conjunctivae are normal.  ENT      Head: Normocephalic and atraumatic.      Nose: No congestion/rhinnorhea.      Mouth/Throat: Mucous membranes are moist.      Neck: No stridor. Hematological/Lymphatic/Immunilogical: No cervical lymphadenopathy. Cardiovascular: Normal rate, regular rhythm.  No murmurs, rubs, or gallops.  Respiratory: Normal respiratory effort without tachypnea nor retractions. Breath sounds are clear and equal bilaterally. No wheezes/rales/rhonchi. Gastrointestinal: Soft and non tender. No rebound. No guarding.  Genitourinary: Deferred Musculoskeletal: Normal range of motion in all extremities. No lower extremity edema. Neurologic:  Somnolent. Post ictal. No seizure activity. Non verbal.  Skin:  Skin is warm, dry and intact. No rash noted. ____________________________________________    LABS (pertinent positives/negatives)  Ethanol <10 BMP wnl except glu 113, BUN 23, cr 1.09 CBC wbc 8.1, hgb 14.2, plt 223 ____________________________________________   EKG  I, , attending physician, personally viewed and interpreted this EKG  EKG Time: 1933 Rate: 75 Rhythm: sinus rhythm Axis: normal Intervals: qtc 462 QRS: narrow ST changes: no st elevation Impression: normal ekg ____________________________________________    RADIOLOGY  CT head No acute  abnormality  ____________________________________________   PROCEDURES  Procedures  ____________________________________________   INITIAL IMPRESSION / ASSESSMENT AND PLAN / ED COURSE  Pertinent labs & imaging results that were available during my care of the patient were reviewed by me and considered in my medical decision making (see chart for details).   Patient presented to the emergency department today after seizure. The seizure lasted roughly 20-25 minutes per report. At the time of presentation patient without any seizure activity. Did give patient dose of IV keppra. On recheck patient was more awake. Was able to respond with short verbal answers. Given length of symptoms and that patient continues to appear post ictal will plan on admission.   ____________________________________________   FINAL CLINICAL IMPRESSION(S) / ED DIAGNOSES  Final diagnoses:  Seizure [facility])     Note: This dictation was prepared with Dragon dictation. Any transcriptional errors that result from this process are unintentional     [facility], MD 02/14/19 2225

## 2019-02-14 NOTE — ED Notes (Signed)
Patient wants to go home, MD notified.

## 2019-02-15 ENCOUNTER — Encounter: Payer: Self-pay | Admitting: Internal Medicine

## 2019-02-15 DIAGNOSIS — R569 Unspecified convulsions: Secondary | ICD-10-CM

## 2019-02-15 LAB — CBC
HCT: 41.1 % (ref 39.0–52.0)
Hemoglobin: 14.2 g/dL (ref 13.0–17.0)
MCH: 30.7 pg (ref 26.0–34.0)
MCHC: 34.5 g/dL (ref 30.0–36.0)
MCV: 88.8 fL (ref 80.0–100.0)
Platelets: 213 10*3/uL (ref 150–400)
RBC: 4.63 MIL/uL (ref 4.22–5.81)
RDW: 13.1 % (ref 11.5–15.5)
WBC: 6.5 10*3/uL (ref 4.0–10.5)
nRBC: 0 % (ref 0.0–0.2)

## 2019-02-15 LAB — URINE DRUG SCREEN, QUALITATIVE (ARMC ONLY)
Amphetamines, Ur Screen: NOT DETECTED
Barbiturates, Ur Screen: NOT DETECTED
Benzodiazepine, Ur Scrn: POSITIVE — AB
Cannabinoid 50 Ng, Ur ~~LOC~~: NOT DETECTED
Cocaine Metabolite,Ur ~~LOC~~: NOT DETECTED
MDMA (Ecstasy)Ur Screen: NOT DETECTED
Methadone Scn, Ur: NOT DETECTED
Opiate, Ur Screen: NOT DETECTED
Phencyclidine (PCP) Ur S: NOT DETECTED
Tricyclic, Ur Screen: NOT DETECTED

## 2019-02-15 LAB — BASIC METABOLIC PANEL
Anion gap: 7 (ref 5–15)
BUN: 25 mg/dL — ABNORMAL HIGH (ref 6–20)
CO2: 28 mmol/L (ref 22–32)
Calcium: 8.9 mg/dL (ref 8.9–10.3)
Chloride: 105 mmol/L (ref 98–111)
Creatinine, Ser: 1.01 mg/dL (ref 0.61–1.24)
GFR calc Af Amer: >60 mL/min (ref >60)
GFR calc non Af Amer: >60 mL/min (ref >60)
Glucose, Bld: 102 mg/dL — ABNORMAL HIGH (ref 70–99)
Potassium: 3.5 mmol/L (ref 3.5–5.1)
Sodium: 140 mmol/L (ref 135–145)

## 2019-02-15 LAB — SARS CORONAVIRUS 2 (TAT 6-24 HRS): SARS Coronavirus 2: NEGATIVE

## 2019-02-15 MED ORDER — ENOXAPARIN SODIUM 40 MG/0.4ML ~~LOC~~ SOLN
40.0000 mg | SUBCUTANEOUS | Status: DC
  Filled 2019-02-15: qty 0.4

## 2019-02-15 MED ORDER — ACETAMINOPHEN 650 MG RE SUPP: 650.0000 mg | Freq: Four times a day (QID) | RECTAL | Status: DC | PRN

## 2019-02-15 MED ORDER — LEVETIRACETAM 500 MG PO TABS: 500.0000 mg | ORAL_TABLET | Freq: Two times a day (BID) | ORAL | 2 refills | Status: DC

## 2019-02-15 MED ORDER — LEVETIRACETAM 500 MG PO TABS
500.0000 mg | ORAL_TABLET | Freq: Two times a day (BID) | ORAL | Status: DC
  Administered 2019-02-15: 500 mg via ORAL
  Filled 2019-02-15: qty 1

## 2019-02-15 MED ORDER — ONDANSETRON HCL 4 MG PO TABS: 4.0000 mg | ORAL_TABLET | Freq: Four times a day (QID) | ORAL | Status: DC | PRN

## 2019-02-15 MED ORDER — SODIUM CHLORIDE 0.9 % IV SOLN
INTRAVENOUS | Status: DC
  Administered 2019-02-15: 03:00:00 via INTRAVENOUS

## 2019-02-15 MED ORDER — ACETAMINOPHEN 325 MG PO TABS: 650.0000 mg | ORAL_TABLET | Freq: Four times a day (QID) | ORAL | Status: DC | PRN

## 2019-02-15 MED ORDER — ONDANSETRON HCL 4 MG/2ML IJ SOLN: 4.0000 mg | Freq: Four times a day (QID) | INTRAMUSCULAR | Status: DC | PRN

## 2019-02-15 NOTE — ED Notes (Signed)

## 2019-02-15 NOTE — ED Notes (Signed)
Pt hit call bell accidentally. Denies any needs. Resting quietly at this time.

## 2019-02-15 NOTE — Discharge Summary (Signed)
Physician Discharge Summary  Daniel Contreras UXN:[id number] DOB: [birth year] DOA: 02/14/2019  PCP: Daniel Contreras, No Pcp Per  Admit date: 02/14/2019 Discharge date: 02/15/2019  Admitted From: home Disposition:  home  Recommendations for Outpatient Follow-up:  1. Follow up with PCP in 1-2 weeks  Home Health: none Equipment/Devices: none  Discharge Condition: stable CODE STATUS: Full code Diet recommendation: regular  HPI: Per admitting MD, Rodney Booze Daniel Contreras is a 33 y.o. male with history of seizures used to be on Keppra has not taken it for many years as Daniel Contreras states he was not having any further seizures was witnessed by family to have continued seizures lasting around 10 minutes when EMS was called.  When EMS arrived at the state as per the report Daniel Contreras was having tonic-clonic seizure 2 mg of IM Versed was given despite which Daniel Contreras was still having seizure for another 10 to 15 minutes. ED Course: In the ER Daniel Contreras was postictal.  CT head is unremarkable Daniel Contreras was afebrile.  Daniel Contreras slowly became more alert awake but at the time of my exam still feeling weak and dizzy.  Daniel Contreras was given Keppra 1 g loading dose since Daniel Contreras has had previous history of seizure and was on Keppra.  Daniel Contreras admitted for further management of seizure.  Labs reveal creatinine 1 platelets 223 WBC 8.1 hemoglobin 14.2 calcium 9.1 potassium 3.5.  Alcohol level is less than 10 COVID-19 is pending.  EKG shows normal sinus rhythm.  Hospital Course / Discharge diagnoses: Breakthrough seizures-likely from adherence to medication, Daniel Contreras was given Keppra and was maintained on 500 mg twice daily which is his old dose.  He returned to baseline, he was observed overnight in the emergency room and has remained stable.  He never had seizure episodes, alert and oriented x4, no other symptoms, able to eat and asking to go home.  He was given a prescription for Keppra 500 mg twice daily for couple of months and with  specific instructions before he runs out of Keppra to seek care to his PCP for refills or go to the nearest urgent care or emergency department to ask for medications.  He was discharged home in stable condition.  Discharge Instructions   Allergies as of 02/15/2019   No Known Allergies     Medication List    TAKE these medications   levETIRAcetam 500 MG tablet Commonly known as: Keppra Take 1 tablet (500 mg total) by mouth 2 (two) times daily.      Follow-up Information    PCP. Schedule an appointment as soon as possible for a visit in 2 week(s).           Consultations:  None  Procedures/Studies:  Ct Head Wo Contrast  Result Date: 02/14/2019 CLINICAL DATA:  33 year old male status post seizure with fall and head injury. EXAM: CT HEAD WITHOUT CONTRAST TECHNIQUE: Contiguous axial images were obtained from the base of the skull through the vertex without intravenous contrast. COMPARISON:  Brain MRI and head CT 08/18/2015. 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 Cerebral volume remains normal. No midline shift, ventriculomegaly, mass effect, intracranial hemorrhage or evidence of cortically based acute infarction. Gray-white matter differentiation is within normal limits throughout the brain. Vascular: No suspicious intracranial vascular hyperdensity. Skull: No acute osseous abnormality identified. Sinuses/Orbits: Largely resolved left sphenoid sinus mucosal thickening and fluid seen in 2017. Visualized paranasal sinuses and mastoids are well pneumatized. Other: No scalp soft tissue injury. Visualized orbit  soft tissues are within normal limits. IMPRESSION: No acute traumatic injury identified. Stable since 2017 and negative non-contrast CT appearance of the brain. Electronically Signed   By: Genevie Ann M.D.   On: 02/14/2019 20:35      Subjective: - no chest pain, shortness of breath, no abdominal  pain, nausea or vomiting.   Discharge Exam: BP 101/65    Pulse (!) 54    Temp 98.6 F (37 C) (Oral)    Resp 18    Ht 5\' 9"  (1.753 m)    Wt 80 kg    SpO2 99%    BMI 26.05 kg/m   General: Pt is alert, awake, not in acute distress Cardiovascular: RRR, S1/S2 +, no rubs, no gallops Respiratory: CTA bilaterally, no wheezing, no rhonchi Abdominal: Soft, NT, ND, bowel sounds + Extremities: no edema, no cyanosis    The results of significant diagnostics from this hospitalization (including imaging, microbiology, ancillary and laboratory) are listed below for reference.     Microbiology: No results found for this or any previous visit (from the past 240 hour(s)).   Labs: Basic Metabolic Panel: Recent Labs  Lab 02/14/19 1934 02/15/19 0545  NA 140 140  K 3.5 3.5  CL 103 105  CO2 24 28  GLUCOSE 113* 102*  BUN 23* 25*  CREATININE 1.09 1.01  CALCIUM 9.1 8.9   Liver Function Tests: No results for input(s): AST, ALT, ALKPHOS, BILITOT, PROT, ALBUMIN in the last 168 hours. CBC: Recent Labs  Lab 02/14/19 1934 02/15/19 0545  WBC 8.1 6.5  NEUTROABS 5.1  --   HGB 14.2 14.2  HCT 41.8 41.1  MCV 90.1 88.8  PLT 223 213   CBG: No results for input(s): GLUCAP in the last 168 hours. Hgb A1c No results for input(s): HGBA1C in the last 72 hours. Lipid Profile No results for input(s): CHOL, HDL, LDLCALC, TRIG, CHOLHDL, LDLDIRECT in the last 72 hours. Thyroid function studies No results for input(s): TSH, T4TOTAL, T3FREE, THYROIDAB in the last 72 hours.  Invalid input(s): FREET3 Urinalysis    Component Value Date/Time   COLORURINE YELLOW (A) 12/21/2017 0135   APPEARANCEUR CLEAR (A) 12/21/2017 0135   APPEARANCEUR Clear 12/15/2017 1321   LABSPEC 1.025 12/21/2017 0135   LABSPEC 1.026 03/25/2013 2207   PHURINE 5.0 12/21/2017 0135   GLUCOSEU NEGATIVE 12/21/2017 0135   GLUCOSEU Negative 03/25/2013 2207   HGBUR NEGATIVE 12/21/2017 0135   BILIRUBINUR NEGATIVE 12/21/2017 0135    BILIRUBINUR Negative 12/15/2017 1321   BILIRUBINUR Negative 03/25/2013 2207   KETONESUR NEGATIVE 12/21/2017 0135   PROTEINUR NEGATIVE 12/21/2017 0135   NITRITE NEGATIVE 12/21/2017 0135   LEUKOCYTESUR NEGATIVE 12/21/2017 0135   LEUKOCYTESUR Trace (A) 12/15/2017 1321   LEUKOCYTESUR Negative 03/25/2013 2207    FURTHER DISCHARGE INSTRUCTIONS:   Get Medicines reviewed and adjusted: Please take all your medications with you for your next visit with your Primary MD   Laboratory/radiological data: Please request your Primary MD to go over all hospital tests and procedure/radiological results at the follow up, please ask your Primary MD to get all Hospital records sent to his/her office.   In some cases, they will be blood work, cultures and biopsy results pending at the time of your discharge. Please request that your primary care M.D. goes through all the records of your hospital data and follows up on these results.   Also Note the following: If you experience worsening of your admission symptoms, develop shortness of breath, life threatening emergency, suicidal or homicidal  thoughts you must seek medical attention immediately by calling 911 or calling your MD immediately  if symptoms less severe.   You must read complete instructions/literature along with all the possible adverse reactions/side effects for all the Medicines you take and that have been prescribed to you. Take any new Medicines after you have completely understood and accpet all the possible adverse reactions/side effects.    Do not drive when taking Pain medications or sleeping medications (Benzodaizepines)   Do not take more than prescribed Pain, Sleep and Anxiety Medications. It is not advisable to combine anxiety,sleep and pain medications without talking with your primary care practitioner   Special Instructions: If you have smoked or chewed Tobacco  in the last 2 yrs please stop smoking, stop any regular Alcohol  and or  any Recreational drug use.   Wear Seat belts while driving.   Please note: You were cared for by a hospitalist during your hospital stay. Once you are discharged, your primary care physician will handle any further medical issues. Please note that NO REFILLS for any discharge medications will be authorized once you are discharged, as it is imperative that you return to your primary care physician (or establish a relationship with a primary care physician if you do not have one) for your post hospital discharge needs so that they can reassess your need for medications and monitor your lab values.  Time coordinating discharge: 25 minutes  SIGNED:  Pamella Pertostin Rain [place], MD, PhD 02/15/2019, 1:17 PM

## 2019-02-15 NOTE — ED Notes (Signed)
Pt resting quietly. Denies any needs at this time.

## 2019-02-15 NOTE — ED Notes (Signed)
Pt given meal tray. Ate 100%

## 2019-02-15 NOTE — ED Notes (Signed)
Patient resting, no complaints at this time. Provided with additional warm blankets. Was given meal tray and orange juice earlier in the evening

## 2019-02-15 NOTE — Discharge Instructions (Signed)
Follow with PCP in 1-2 weeks  Please do not discontinue Keppra, if you are running low please reach out to your PCP or if do not have a PCP please come to the nearest Urgent care or ED and ask for a refill!!!  Please get a complete blood count and chemistry panel checked by your Primary MD at your next visit, and again as instructed by your Primary MD. Please get your medications reviewed and adjusted by your Primary MD.  Please request your Primary MD to go over all Hospital Tests and Procedure/Radiological results at the follow up, please get all Hospital records sent to your Prim MD by signing hospital release before you go home.  In some cases, there will be blood work, cultures and biopsy results pending at the time of your discharge. Please request that your primary care M.D. goes through all the records of your hospital data and follows up on these results.  If you had Pneumonia of Lung problems at the Hospital: Please get a 2 view Chest X ray done in 6-8 weeks after hospital discharge or sooner if instructed by your Primary MD.  If you have Congestive Heart Failure: Please call your Cardiologist or Primary MD anytime you have any of the following symptoms:  1) 3 pound weight gain in 24 hours or 5 pounds in 1 week  2) shortness of breath, with or without a dry hacking cough  3) swelling in the hands, feet or stomach  4) if you have to sleep on extra pillows at night in order to breathe  Follow cardiac low salt diet and 1.5 lit/day fluid restriction.  If you have diabetes Accuchecks 4 times/day, Once in AM empty stomach and then before each meal. Log in all results and show them to your primary doctor at your next visit. If any glucose reading is under 80 or above 300 call your primary MD immediately.  If you have Seizure/Convulsions/Epilepsy: Please do not drive, operate heavy machinery, participate in activities at heights or participate in high speed sports until you have seen by  Primary MD or a Neurologist and advised to do so again. Per [facility] statutes, patients with seizures are not allowed to drive until they have been seizure-free for six months.  Use caution when using heavy equipment or power tools. Avoid working on ladders or at heights. Take showers instead of baths. Ensure the water temperature is not too high on the home water heater. Do not go swimming alone. Do not lock yourself in a room alone (i.e. bathroom). When caring for infants or small children, sit down when holding, feeding, or changing them to minimize risk of injury to the child in the event you have a seizure. Maintain good sleep hygiene. Avoid alcohol.   If you had Gastrointestinal Bleeding: Please ask your Primary MD to check a complete blood count within one week of discharge or at your next visit. Your endoscopic/colonoscopic biopsies that are pending at the time of discharge, will also need to followed by your Primary MD.  Get Medicines reviewed and adjusted. Please take all your medications with you for your next visit with your Primary MD  Please request your Primary MD to go over all hospital tests and procedure/radiological results at the follow up, please ask your Primary MD to get all Hospital records sent to his/her office.  If you experience worsening of your admission symptoms, develop shortness of breath, life threatening emergency, suicidal or homicidal thoughts you must seek  medical attention immediately by calling 911 or calling your MD immediately  if symptoms less severe.  You must read complete instructions/literature along with all the possible adverse reactions/side effects for all the Medicines you take and that have been prescribed to you. Take any new Medicines after you have completely understood and accpet all the possible adverse reactions/side effects.   Do not drive or operate heavy machinery when taking Pain medications.   Do not take more than prescribed  Pain, Sleep and Anxiety Medications  Special Instructions: If you have smoked or chewed Tobacco  in the last 2 yrs please stop smoking, stop any regular Alcohol  and or any Recreational drug use.  Wear Seat belts while driving.  Please note You were cared for by a hospitalist during your hospital stay. If you have any questions about your discharge medications or the care you received while you were in the hospital after you are discharged, you can call the unit and asked to speak with the hospitalist on call if the hospitalist that took care of you is not available. Once you are discharged, your primary care physician will handle any further medical issues. Please note that NO REFILLS for any discharge medications will be authorized once you are discharged, as it is imperative that you return to your primary care physician (or establish a relationship with a primary care physician if you do not have one) for your aftercare needs so that they can reassess your need for medications and monitor your lab values.  You can reach the hospitalist office at phone [phone] or fax [phone]   If you do not have a primary care physician, you can call [phone] for a physician referral.  Activity: As tolerated with Full fall precautions use walker/cane & assistance as needed    Diet: regular  Disposition Home

## 2019-02-15 NOTE — ED Notes (Signed)
Patient resting.

## 2019-02-15 NOTE — ED Notes (Signed)
Checked on pt. Denies any needs at this time. Resting quietly.

## 2019-02-15 NOTE — ED Notes (Signed)
Notified by attending MD that pt may be discharged at this time. Pt called family member for ride. Awaiting arrival. First nurse aware.

## 2019-02-15 NOTE — H&P (Signed)
History and Physical    Daniel Contreras HKV:[id number] DOB: 09/08/1985 DOA: 02/14/2019  PCP: Patient, No Pcp Per  Patient coming from: Home.  Chief Complaint: Seizure.  HPI: Daniel Contreras is a 33 y.o. male with history of seizures used to be on Keppra has not taken it for many years as patient states he was not having any further seizures was witnessed by family to have continued seizures lasting around 10 minutes when EMS was called.  When EMS arrived at the state as per the report patient was having tonic-clonic seizure 2 mg of IM Versed was given despite which patient was still having seizure for another 10 to 15 minutes.  ED Course: In the ER patient was postictal.  CT head is unremarkable patient was afebrile.  Patient slowly became more alert awake but at the time of my exam still feeling weak and dizzy.  Patient was given Keppra 1 g loading dose since patient has had previous history of seizure and was on Keppra.  Patient admitted for further management of seizure.  Labs reveal creatinine 1 platelets 223 WBC 8.1 hemoglobin 14.2 calcium 9.1 potassium 3.5.  Alcohol level is less than 10 COVID-19 is pending.  EKG shows normal sinus rhythm.  Review of Systems: As per HPI, rest all negative.   Past Medical History:  Diagnosis Date   Seizures ([place])     History reviewed. No pertinent surgical history.   reports that he has never smoked. He has never used smokeless tobacco. He reports that he does not drink alcohol or use drugs.  No Known Allergies  Family History  Problem Relation Age of Onset   Seizures Neg Hx     Prior to Admission medications   Medication Sig Start Date End Date Taking? Authorizing Provider  diazepam (VALIUM) 5 MG tablet Take 1 tablet (5 mg total) by mouth every 8 (eight) hours as needed for muscle spasms. 08/12/17   Earleen Newport, MD  diphenhydrAMINE (BENYLIN) 12.5 MG/5ML syrup Take 5 mLs (12.5 mg total) by mouth 4 (four) times daily as  needed for allergies. 07/01/17   Sable Feil, PA-C  famotidine (PEPCID) 20 MG tablet Take 1 tablet (20 mg total) by mouth 2 (two) times daily. 04/04/17 04/04/18  Darel Hong, MD  famotidine (PEPCID) 20 MG tablet Take 1 tablet (20 mg total) by mouth 2 (two) times daily. 08/12/17   Earleen Newport, MD  fexofenadine-pseudoephedrine (ALLEGRA-D) 60-120 MG 12 hr tablet Take 1 tablet by mouth 2 (two) times daily. 07/01/17   Sable Feil, PA-C  gentamicin (GARAMYCIN) 0.3 % ophthalmic solution Place 1 drop into the left eye every 4 (four) hours. 10/13/16   Sable Feil, PA-C  levETIRAcetam (KEPPRA) 500 MG tablet Take 1 tablet (500 mg total) by mouth 2 (two) times daily. 08/18/15   Earleen Newport, MD  lidocaine (XYLOCAINE) 2 % solution Use as directed 5 mLs in the mouth or throat every 6 (six) hours as needed for mouth pain. Mix with 5 mL of Benadryl for swish and swallow. 07/01/17   Sable Feil, PA-C  loperamide (IMODIUM A-D) 2 MG tablet Take 1 tablet (2 mg total) by mouth 4 (four) times daily as needed for diarrhea or loose stools. 04/15/16   Cuthriell, Charline Bills, PA-C  magic mouthwash SOLN Take 5 mLs by mouth 3 (three) times daily as needed for mouth pain. 03/25/17   Paulette Blanch, MD  methylPREDNISolone (MEDROL DOSEPAK) 4 MG TBPK tablet Take  Tapered dose as directed 07/01/17   Joni Reining, PA-C  naproxen (NAPROSYN) 500 MG tablet Take 1 tablet (500 mg total) by mouth 2 (two) times daily with a meal. 10/13/16   Joni Reining, PA-C  naproxen (NAPROSYN) 500 MG tablet Take 1 tablet (500 mg total) by mouth 2 (two) times daily with a meal. 12/14/17   Joni Reining, PA-C  ondansetron (ZOFRAN-ODT) 4 MG disintegrating tablet Take 1 tablet (4 mg total) by mouth every 8 (eight) hours as needed for nausea or vomiting. 04/15/16   Cuthriell, Delorise Royals, PA-C  oxyCODONE-acetaminophen (PERCOCET) 7.5-325 MG tablet Take 1 tablet by mouth every 6 (six) hours as needed. 12/14/17   Joni Reining, PA-C     Physical Exam: Constitutional: Moderately built and nourished. Vitals:   02/14/19 2230 02/14/19 2300 02/14/19 2330 02/15/19 0000  BP: 95/62 104/72 110/67 112/78  Pulse: 61 66 62 68  Resp: 18 20 15  (!) 22  Temp:      TempSrc:      SpO2: 98% 100% 100% 100%  Weight:      Height:       Eyes: Anicteric no pallor. ENMT: No discharge from the ears eyes nose or mouth. Neck: No mass or.  No neck rigidity. Respiratory: No rhonchi or crepitations. Cardiovascular: S1-S2 heard. Abdomen: Soft nontender bowel sounds present. Musculoskeletal: No edema. Skin: No rash. Neurologic: Alert awake appears weak but is moving all extremities without difficulty.  Oriented to time place and person. Psychiatric: Oriented to time place and person.   Labs on Admission: I have personally reviewed following labs and imaging studies  CBC: Recent Labs  Lab 02/14/19 1934  WBC 8.1  NEUTROABS 5.1  HGB 14.2  HCT 41.8  MCV 90.1  PLT 223   Basic Metabolic Panel: Recent Labs  Lab 02/14/19 1934  NA 140  K 3.5  CL 103  CO2 24  GLUCOSE 113*  BUN 23*  CREATININE 1.09  CALCIUM 9.1   GFR: Estimated Creatinine Clearance: 97.3 mL/min (by C-G formula based on SCr of 1.09 mg/dL). Liver Function Tests: No results for input(s): AST, ALT, ALKPHOS, BILITOT, PROT, ALBUMIN in the last 168 hours. No results for input(s): LIPASE, AMYLASE in the last 168 hours. No results for input(s): AMMONIA in the last 168 hours. Coagulation Profile: No results for input(s): INR, PROTIME in the last 168 hours. Cardiac Enzymes: No results for input(s): CKTOTAL, CKMB, CKMBINDEX, TROPONINI in the last 168 hours. BNP (last 3 results) No results for input(s): PROBNP in the last 8760 hours. HbA1C: No results for input(s): HGBA1C in the last 72 hours. CBG: No results for input(s): GLUCAP in the last 168 hours. Lipid Profile: No results for input(s): CHOL, HDL, LDLCALC, TRIG, CHOLHDL, LDLDIRECT in the last 72  hours. Thyroid Function Tests: No results for input(s): TSH, T4TOTAL, FREET4, T3FREE, THYROIDAB in the last 72 hours. Anemia Panel: No results for input(s): VITAMINB12, FOLATE, FERRITIN, TIBC, IRON, RETICCTPCT in the last 72 hours. Urine analysis:    Component Value Date/Time   COLORURINE YELLOW (A) 12/21/2017 0135   APPEARANCEUR CLEAR (A) 12/21/2017 0135   APPEARANCEUR Clear 12/15/2017 1321   LABSPEC 1.025 12/21/2017 0135   LABSPEC 1.026 03/25/2013 2207   PHURINE 5.0 12/21/2017 0135   GLUCOSEU NEGATIVE 12/21/2017 0135   GLUCOSEU Negative 03/25/2013 2207   HGBUR NEGATIVE 12/21/2017 0135   BILIRUBINUR NEGATIVE 12/21/2017 0135   BILIRUBINUR Negative 12/15/2017 1321   BILIRUBINUR Negative 03/25/2013 2207   KETONESUR NEGATIVE 12/21/2017 0135  PROTEINUR NEGATIVE 12/21/2017 0135   NITRITE NEGATIVE 12/21/2017 0135   LEUKOCYTESUR NEGATIVE 12/21/2017 0135   LEUKOCYTESUR Trace (A) 12/15/2017 1321   LEUKOCYTESUR Negative 03/25/2013 2207   Sepsis Labs: @LABRCNTIP (procalcitonin:4,lacticidven:4) )No results found for this or any previous visit (from the past 240 hour(s)).   Radiological Exams on Admission: Ct Head Wo Contrast  Result Date: 02/14/2019 CLINICAL DATA:  33 year old male status post seizure with fall and head injury. EXAM: CT HEAD WITHOUT CONTRAST TECHNIQUE: Contiguous axial images were obtained from the base of the skull through the vertex without intravenous contrast. COMPARISON:  Brain MRI and head CT 08/18/2015. 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 Cerebral volume remains normal. No midline shift, ventriculomegaly, mass effect, intracranial hemorrhage or evidence of cortically based acute infarction. Gray-white matter differentiation is within normal limits throughout the brain. Vascular: No suspicious intracranial vascular hyperdensity. Skull: No acute osseous abnormality identified.  Sinuses/Orbits: Largely resolved left sphenoid sinus mucosal thickening and fluid seen in 2017. Visualized paranasal sinuses and mastoids are well pneumatized. Other: No scalp soft tissue injury. Visualized orbit soft tissues are within normal limits. IMPRESSION: No acute traumatic injury identified. Stable since 2017 and negative non-contrast CT appearance of the brain. Electronically Signed   By: Odessa FlemingH  Hall M.D.   On: 02/14/2019 20:35    EKG: Independently reviewed.  Normal sinus rhythm.  Assessment/Plan Principal Problem:   Seizure (H[facility])    1. Breakthrough seizures likely from noncompliance with medication.  Patient was given Keppra 1 g and will keep patient appointed twice daily.  Patient still feels dizzy and weak.  We will continue to observe overnight.  Urine drug screen is pending continue with gentle hydration.  COVID-19 test is pending.   DVT prophylaxis: Lovenox. Code Status: Full code. Family Communication: Discussed with patient. Disposition Plan: Home. Consults called: None. Admission status: Observation.   Eduard ClosArshad N Azaela Caracci MD [facility] Pager [phone].  If 7PM-7AM, please contact night-coverage [email or web address] Password [email or web address]  02/15/2019, 12:54 AM

## 2019-02-16 LAB — HIV ANTIBODY (ROUTINE TESTING W REFLEX): HIV Screen 4th Generation wRfx: NONREACTIVE — AB

## 2019-04-20 ENCOUNTER — Emergency Department
Admission: EM | Admit: 2019-04-20 | Discharge: 2019-04-20 | Disposition: A | Discharge status: Home / Self Care | Payer: Self-pay | Attending: Emergency Medicine | Admitting: Emergency Medicine

## 2019-04-20 ENCOUNTER — Emergency Department: Payer: Self-pay

## 2019-04-20 ENCOUNTER — Other Ambulatory Visit: Payer: Self-pay

## 2019-04-20 DIAGNOSIS — Z20822 Contact with and (suspected) exposure to covid-19: Secondary | ICD-10-CM | POA: Insufficient documentation

## 2019-04-20 DIAGNOSIS — U071 COVID-19: Secondary | ICD-10-CM

## 2019-04-20 DIAGNOSIS — R05 Cough: Secondary | ICD-10-CM | POA: Insufficient documentation

## 2019-04-20 DIAGNOSIS — R509 Fever, unspecified: Secondary | ICD-10-CM | POA: Insufficient documentation

## 2019-04-20 DIAGNOSIS — R0602 Shortness of breath: Secondary | ICD-10-CM | POA: Insufficient documentation

## 2019-04-20 MED ORDER — ONDANSETRON 4 MG PO TBDP
4.0000 mg | ORAL_TABLET | Freq: Once | ORAL | Status: AC
  Administered 2019-04-20: 4 mg via ORAL
  Filled 2019-04-20: qty 1

## 2019-04-20 MED ORDER — PREDNISONE 20 MG PO TABS: ORAL_TABLET | ORAL | 0 refills | Status: DC

## 2019-04-20 MED ORDER — ALBUTEROL SULFATE HFA 108 (90 BASE) MCG/ACT IN AERS: 2.0000 | INHALATION_SPRAY | Freq: Four times a day (QID) | RESPIRATORY_TRACT | 0 refills | Status: DC | PRN

## 2019-04-20 MED ORDER — PREDNISONE 20 MG PO TABS
60.0000 mg | ORAL_TABLET | Freq: Once | ORAL | Status: AC
  Administered 2019-04-20: 60 mg via ORAL
  Filled 2019-04-20: qty 3

## 2019-04-20 MED ORDER — AZITHROMYCIN 250 MG PO TABS: 250.0000 mg | ORAL_TABLET | Freq: Every day | ORAL | 0 refills | Status: AC

## 2019-04-20 MED ORDER — ONDANSETRON 4 MG PO TBDP: 4.0000 mg | ORAL_TABLET | Freq: Three times a day (TID) | ORAL | 0 refills | Status: DC | PRN

## 2019-04-20 MED ORDER — AZITHROMYCIN 500 MG PO TABS
500.0000 mg | ORAL_TABLET | Freq: Once | ORAL | Status: AC
  Administered 2019-04-20: 16:00:00 500 mg via ORAL
  Filled 2019-04-20: qty 1

## 2019-04-20 NOTE — ED Triage Notes (Signed)
Pt c/o cough with fever,.chills, nausea, sob that started yesterday. Pt is in NAD at present.

## 2019-04-20 NOTE — Discharge Instructions (Signed)
You are being treated for COVID. Remain under house quarantine until your results are improved. Take the prescription meds as directed. Return to the ED as needed.

## 2019-04-20 NOTE — ED Provider Notes (Signed)
[facility] Emergency Department Provider Note ____________________________________________  Time seen: 1340  I have reviewed the triage vital signs and the nursing notes.  HISTORY  Chief Complaint  URI History limited by Spanish language.  Interpreter Kennyth Lose B.)  Present for interview and exam.  HPI Daniel Contreras is a 34 y.o. male presents with self to the ED for evaluation of a 1 day complaint of cough, fever, chills, nausea, shortness of breath.  Denies any sick contacts, but does admit to having had regular custodial visit with his children yesterday.  He also admits to generalized body aches, weakness, and change in his taste sensation.  He reports previously being tested for Covid, but denies any previous positive test results.  Patient called out of work today due to his sudden symptom onset.  He has been taking daily aspirin for symptoms as well as his regular Keppra.  Past Medical History:  Diagnosis Date  . Seizures [facility])     Patient Active Problem List   Diagnosis Date Noted  . Seizure ([place]) 02/14/2019  . Scrotal injury, initial encounter 12/15/2017    History reviewed. No pertinent surgical history.  Prior to Admission medications   Medication Sig Start Date End Date Taking? Authorizing Provider  albuterol (VENTOLIN HFA) 108 (90 Base) MCG/ACT inhaler Inhale 2 puffs into the lungs every 6 (six) hours as needed. 04/20/19   Miah Boye, Dannielle Karvonen, PA-C  azithromycin (ZITHROMAX Z-PAK) 250 MG tablet Take 1 tablet (250 mg total) by mouth daily for 4 days. 04/21/19 04/25/19  Karron Alvizo, Dannielle Karvonen, PA-C  levETIRAcetam (KEPPRA) 500 MG tablet Take 1 tablet (500 mg total) by mouth 2 (two) times daily. 02/15/19   Caren Griffins, MD  ondansetron (ZOFRAN ODT) 4 MG disintegrating tablet Take 1 tablet (4 mg total) by mouth every 8 (eight) hours as needed. 04/20/19   Dontavian Marchi, Dannielle Karvonen, PA-C  predniSONE (DELTASONE) 20 MG tablet Take 3 tabs daily x 2  days; Take 2 tabs daily x 3 days; Take 1 tab daily x 3 days; Take 0.5 tab daily x 4 days 04/21/19   Cheskel Silverio, Dannielle Karvonen, PA-C    Allergies Patient has no known allergies.  Family History  Problem Relation Age of Onset  . Seizures Neg Hx     Social History Social History   Tobacco Use  . Smoking status: Never Smoker  . Smokeless tobacco: Never Used  Substance Use Topics  . Alcohol use: No  . Drug use: No    Review of Systems  Constitutional: Positive for fever, chills, and malaise Eyes: Negative for visual changes. ENT: Negative for sore throat. Cardiovascular: Negative for chest pain. Respiratory: Negative for shortness of breath. Gastrointestinal: Negative for abdominal pain and diarrhea.  Reports nausea as above. Genitourinary: Negative for dysuria. Musculoskeletal: Negative for back pain.  Reports generalized body aches.  Skin: Negative for rash. Neurological: Negative for headaches, focal weakness or numbness. ____________________________________________  PHYSICAL EXAM:  VITAL SIGNS: ED Triage Vitals  Enc Vitals Group     BP 04/20/19 1353 120/73     Pulse Rate 04/20/19 1353 98     Resp 04/20/19 1353 16     Temp 04/20/19 1353 98 F (36.7 C)     Temp Source 04/20/19 1353 Oral     SpO2 04/20/19 1353 99 %     Weight 04/20/19 1354 166 lb (75.3 kg)     Height 04/20/19 1354 5\' 4"  (1.626 m)     Head Circumference --  Peak Flow --      Pain Score 04/20/19 1354 7     Pain Loc --      Pain Edu? --      Excl. in GC? --     Constitutional: Alert and oriented. Well appearing and in no distress. Head: Normocephalic and atraumatic. Eyes: Conjunctivae are normal. Normal extraocular movements Cardiovascular: Normal rate, regular rhythm. Normal distal pulses. Respiratory: Normal respiratory effort. No wheezes/rales/rhonchi. Gastrointestinal: Soft and nontender. No distention. Musculoskeletal: Nontender with normal range of motion in all extremities.   Neurologic:  Normal gait without ataxia. Normal speech and language. No gross focal neurologic deficits are appreciated. Skin:  Skin is warm, dry and intact. No rash noted. ____________________________________________   LABS (pertinent positives/negatives)  Labs Reviewed  SARS CORONAVIRUS 2 (TAT 6-24 HRS)  ____________________________________________   RADIOLOGY  CXR 1V  IMPRESSION: No active disease. ____________________________________________  PROCEDURES  Azithromycin 500 mg PO Prednisone 60 mg PO Zofran 4 gm ODT  Procedures ____________________________________________  INITIAL IMPRESSION / ASSESSMENT AND PLAN / ED COURSE  Patient with ED evaluations of symptoms concerning for possible Covid exposure.  Patient be treated empirically with steroids, inhalers, and antibiotics.  He is referred to his primary provider for ongoing symptoms peer return precautions have been reviewed.  A work note is written providing him with out of work coverage for the next 2 weeks.  Daniel Contreras was evaluated in Emergency Department on 04/20/2019 for the symptoms described in the history of present illness. He was evaluated in the context of the global COVID-19 pandemic, which necessitated consideration that the patient might be at risk for infection with the SARS-CoV-2 virus that causes COVID-19. Institutional protocols and algorithms that pertain to the evaluation of patients at risk for COVID-19 are in a state of rapid change based on information released by regulatory bodies including the CDC and federal and state organizations. These policies and algorithms were followed during the patient's care in the ED. ____________________________________________  FINAL CLINICAL IMPRESSION(S) / ED DIAGNOSES  Final diagnoses:  Clinical diagnosis of COVID-19      Lissa Hoard, PA-C 04/20/19 1755    Minna Antis, MD 04/20/19 [phone]

## 2019-04-20 NOTE — ED Notes (Signed)
Pt called from [facility] to tx rm with no answer.

## 2019-04-21 LAB — SARS CORONAVIRUS 2 (TAT 6-24 HRS): SARS Coronavirus 2: NEGATIVE

## 2019-09-02 ENCOUNTER — Encounter: Payer: Self-pay | Admitting: Emergency Medicine

## 2019-09-02 ENCOUNTER — Emergency Department
Admission: EM | Admit: 2019-09-02 | Discharge: 2019-09-02 | Disposition: A | Discharge status: Home / Self Care | Payer: Self-pay | Attending: Emergency Medicine | Admitting: Emergency Medicine

## 2019-09-02 ENCOUNTER — Emergency Department: Payer: Self-pay

## 2019-09-02 ENCOUNTER — Other Ambulatory Visit: Payer: Self-pay

## 2019-09-02 DIAGNOSIS — N5082 Scrotal pain: Secondary | ICD-10-CM | POA: Insufficient documentation

## 2019-09-02 DIAGNOSIS — R569 Unspecified convulsions: Secondary | ICD-10-CM | POA: Insufficient documentation

## 2019-09-02 DIAGNOSIS — N50819 Testicular pain, unspecified: Secondary | ICD-10-CM | POA: Insufficient documentation

## 2019-09-02 DIAGNOSIS — Z79899 Other long term (current) drug therapy: Secondary | ICD-10-CM | POA: Insufficient documentation

## 2019-09-02 LAB — CBC
HCT: 47.1 % (ref 39.0–52.0)
Hemoglobin: 16 g/dL (ref 13.0–17.0)
MCH: 30.5 pg (ref 26.0–34.0)
MCHC: 34 g/dL (ref 30.0–36.0)
MCV: 89.9 fL (ref 80.0–100.0)
Platelets: 217 10*3/uL (ref 150–400)
RBC: 5.24 MIL/uL (ref 4.22–5.81)
RDW: 12.8 % (ref 11.5–15.5)
WBC: 8.7 10*3/uL (ref 4.0–10.5)
nRBC: 0 % (ref 0.0–0.2)

## 2019-09-02 LAB — CHLAMYDIA/NGC RT PCR (ARMC ONLY)
Chlamydia Tr: NOT DETECTED
N gonorrhoeae: NOT DETECTED

## 2019-09-02 LAB — URINALYSIS, COMPLETE (UACMP) WITH MICROSCOPIC
Bacteria, UA: NONE SEEN
Bilirubin Urine: NEGATIVE
Glucose, UA: NEGATIVE mg/dL
Hgb urine dipstick: NEGATIVE
Ketones, ur: NEGATIVE mg/dL
Leukocytes,Ua: NEGATIVE
Nitrite: NEGATIVE
Protein, ur: NEGATIVE mg/dL
Specific Gravity, Urine: 1.03 (ref 1.005–1.030)
pH: 5 (ref 5.0–8.0)

## 2019-09-02 LAB — COMPREHENSIVE METABOLIC PANEL
ALT: 57 U/L — ABNORMAL HIGH (ref 0–44)
AST: 29 U/L (ref 15–41)
Albumin: 4.7 g/dL (ref 3.5–5.0)
Alkaline Phosphatase: 81 U/L (ref 38–126)
Anion gap: 9 (ref 5–15)
BUN: 21 mg/dL — ABNORMAL HIGH (ref 6–20)
CO2: 25 mmol/L (ref 22–32)
Calcium: 9.1 mg/dL (ref 8.9–10.3)
Chloride: 104 mmol/L (ref 98–111)
Creatinine, Ser: 1.09 mg/dL (ref 0.61–1.24)
GFR calc Af Amer: >60 mL/min (ref >60)
GFR calc non Af Amer: >60 mL/min (ref >60)
Glucose, Bld: 113 mg/dL — ABNORMAL HIGH (ref 70–99)
Potassium: 3.9 mmol/L (ref 3.5–5.1)
Sodium: 138 mmol/L (ref 135–145)
Total Bilirubin: 0.7 mg/dL (ref 0.3–1.2)
Total Protein: 7.7 g/dL (ref 6.5–8.1)

## 2019-09-02 MED ORDER — ONDANSETRON HCL 4 MG/2ML IJ SOLN
4.0000 mg | Freq: Once | INTRAMUSCULAR | Status: AC
  Administered 2019-09-02: 4 mg via INTRAVENOUS
  Filled 2019-09-02: qty 2

## 2019-09-02 MED ORDER — TRAMADOL HCL 50 MG PO TABS: 50.0000 mg | ORAL_TABLET | Freq: Four times a day (QID) | ORAL | 0 refills | Status: AC | PRN

## 2019-09-02 MED ORDER — FENTANYL CITRATE (PF) 100 MCG/2ML IJ SOLN
50.0000 ug | INTRAMUSCULAR | Status: DC | PRN
  Administered 2019-09-02: 50 ug via INTRAVENOUS
  Filled 2019-09-02: qty 2

## 2019-09-02 NOTE — ED Provider Notes (Signed)
Emergency Department Provider Note  ____________________________________________  Time seen: Approximately 10:23 PM  I have reviewed the triage vital signs and the nursing notes.   HISTORY  Chief Complaint Testicle Pain   Historian Patient     HPI Daniel Contreras is a 34 y.o. male presents to the emergency department with testicular pain that started yesterday after playing soccer.  Patient states that he has had similar pain in the past after playing soccer and was diagnosed with a scrotal hematoma and became concerned.  He denies fever or chills at home.  No dysuria, hematuria or increased urinary frequency.  He denies injury during soccer.  He denies scrotal swelling or erythema.  No history of testicular torsion.  He states that in the past, his scrotal pain resolved without need for intervention.  No other alleviating measures have been attempted.   Past Medical History:  Diagnosis Date  . Seizures (HCC)      Immunizations up to date:  Yes.     Past Medical History:  Diagnosis Date  . Seizures [facility])     Patient Active Problem List   Diagnosis Date Noted  . Seizure (HCC) 02/14/2019  . Scrotal injury, initial encounter 12/15/2017    History reviewed. No pertinent surgical history.  Prior to Admission medications   Medication Sig Start Date End Date Taking? Authorizing Provider  albuterol (VENTOLIN HFA) 108 (90 Base) MCG/ACT inhaler Inhale 2 puffs into the lungs every 6 (six) hours as needed. 04/20/19   Menshew, Charlesetta Ivory, PA-C  levETIRAcetam (KEPPRA) 500 MG tablet Take 1 tablet (500 mg total) by mouth 2 (two) times daily. 02/15/19   Leatha Gilding, MD  ondansetron (ZOFRAN ODT) 4 MG disintegrating tablet Take 1 tablet (4 mg total) by mouth every 8 (eight) hours as needed. 04/20/19   Menshew, Charlesetta Ivory, PA-C  predniSONE (DELTASONE) 20 MG tablet Take 3 tabs daily x 2 days; Take 2 tabs daily x 3 days; Take 1 tab daily x 3 days; Take 0.5 tab daily x 4  days 04/21/19   Menshew, Charlesetta Ivory, PA-C  traMADol (ULTRAM) 50 MG tablet Take 1 tablet (50 mg total) by mouth every 6 (six) hours as needed for up to 3 days. 09/02/19 09/05/19  Orvil Feil, PA-C    Allergies Patient has no known allergies.  Family History  Problem Relation Age of Onset  . Seizures Neg Hx     Social History Social History   Tobacco Use  . Smoking status: Never Smoker  . Smokeless tobacco: Never Used  Substance Use Topics  . Alcohol use: No  . Drug use: No     Review of Systems  Constitutional: No fever/chills Eyes:  No discharge ENT: No upper respiratory complaints. Respiratory: no cough. No SOB/ use of accessory muscles to breath Gastrointestinal:   No nausea, no vomiting.  No diarrhea.  No constipation. Genitourinary: Patient has scrotal pain.  Musculoskeletal: Negative for musculoskeletal pain. Skin: Negative for rash, abrasions, lacerations, ecchymosis. ____________________________________________   PHYSICAL EXAM:  VITAL SIGNS: ED Triage Vitals [09/02/19 1838]  Enc Vitals Group     BP (!) 146/94     Pulse Rate 88     Resp 12     Temp 99 F (37.2 C)     Temp Source Oral     SpO2 98 %     Weight 166 lb 0.1 oz (75.3 kg)     Height 5\' 4"  (1.626 m)     Head  Circumference      Peak Flow      Pain Score 10     Pain Loc      Pain Edu?      Excl. in [place]?      Constitutional: Alert and oriented. Well appearing and in no acute distress. Eyes: Conjunctivae are normal. PERRL. EOMI. Head: Atraumatic. ENT:      Nose: No congestion/rhinnorhea.      Mouth/Throat: Mucous membranes are moist.  Neck: No stridor.  No cervical spine tenderness to palpation.  Cardiovascular: Normal rate, regular rhythm. Normal S1 and S2.  Good peripheral circulation. Respiratory: Normal respiratory effort without tachypnea or retractions. Lungs CTAB. Good air entry to the bases with no decreased or absent breath sounds Gastrointestinal: Bowel sounds x 4 quadrants.  Soft and nontender to palpation. No guarding or rigidity. No distention. Musculoskeletal: Full range of motion to all extremities. No obvious deformities noted Neurologic:  Normal for age. No gross focal neurologic deficits are appreciated.  Skin:  Skin is warm, dry and intact. No rash noted. Psychiatric: Mood and affect are normal for age. Speech and behavior are normal.   ____________________________________________   LABS (all labs ordered are listed, but only abnormal results are displayed)  Labs Reviewed  COMPREHENSIVE METABOLIC PANEL - Abnormal; Notable for the following components:      Result Value   Glucose, Bld 113 (*)    BUN 21 (*)    ALT 57 (*)    All other components within normal limits  URINALYSIS, COMPLETE (UACMP) WITH MICROSCOPIC - Abnormal; Notable for the following components:   Color, Urine YELLOW (*)    APPearance HAZY (*)    All other components within normal limits  CHLAMYDIA/NGC RT PCR ([facility] ONLY)  CBC   ____________________________________________  EKG   ____________________________________________  RADIOLOGY Unk Pinto, personally viewed and evaluated these images (plain radiographs) as part of my medical decision making, as well as reviewing the written report by the radiologist.  CT Head Wo Contrast  Result Date: 09/02/2019 CLINICAL DATA:  Testicular pain. EXAM: CT HEAD WITHOUT CONTRAST TECHNIQUE: Contiguous axial images were obtained from the base of the skull through the vertex without intravenous contrast. COMPARISON:  February 14, 2019 FINDINGS: Brain: No evidence of acute infarction, hemorrhage, hydrocephalus, extra-axial collection or mass lesion/mass effect. 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 Vascular: No hyperdense vessel or unexpected calcification. Skull: Normal. Negative for fracture or focal lesion. Sinuses/Orbits: No acute finding. Other: None. IMPRESSION: No acute intracranial  abnormality. Electronically Signed   By: Virgina Norfolk M.D.   On: 09/02/2019 20:17   US SCROTUM W/DOPPLER  Result Date: 09/02/2019 CLINICAL DATA:  Left-sided testicular pain. EXAM: SCROTAL ULTRASOUND DOPPLER ULTRASOUND OF THE TESTICLES TECHNIQUE: Complete ultrasound examination of the testicles, epididymis, and other scrotal structures was performed. Color and spectral Doppler ultrasound were also utilized to evaluate blood flow to the testicles. COMPARISON:  None. FINDINGS: Right testicle Measurements: 4.0 cm x 1.9 cm x 2.3 cm. No mass or microlithiasis visualized. Left testicle Measurements: 4.2 cm x 1.7 cm x 2.6 cm. No mass or microlithiasis visualized. Right epididymis:  Normal in size and appearance. Left epididymis:  Normal in size and appearance. Hydrocele:  None visualized. Varicocele:  None visualized. Pulsed Doppler interrogation of both testes demonstrates normal low resistance arterial and venous waveforms bilaterally. IMPRESSION: Normal testicular ultrasound. Electronically Signed   By: Virgina Norfolk M.D.   On: 09/02/2019 20:20    ____________________________________________  PROCEDURES  Procedure(s) performed:     Procedures     Medications  fentaNYL (SUBLIMAZE) injection 50 mcg (50 mcg Intravenous Given 09/02/19 1852)  ondansetron (ZOFRAN) injection 4 mg (4 mg Intravenous Given 09/02/19 1852)     ____________________________________________   INITIAL IMPRESSION / ASSESSMENT AND PLAN / ED COURSE  Pertinent labs & imaging results that were available during my care of the patient were reviewed by me and considered in my medical decision making (see chart for details).    Assessment and plan Testicular pain. 34 year old male presents to the emergency department with testicular pain that has occurred for the past 24 hours.  Patient was mildly hypertensive at triage but vital signs were otherwise reassuring.  Scrotal ultrasound revealed no evidence of scrotal  hematoma, hydrocele, epididymitis or testicular torsion.  Urinalysis revealed no evidence of cystitis.  Patient was discharged with tramadol for pain.  Recommended ice and scrotal elevation at home.  He was given a follow-up referral to urology if pain persist.  Return precautions were given to return with new or worsening symptoms.   ____________________________________________  FINAL CLINICAL IMPRESSION(S) / ED DIAGNOSES  Final diagnoses:  Scrotal pain      NEW MEDICATIONS STARTED DURING THIS VISIT:  ED Discharge Orders         Ordered    traMADol (ULTRAM) 50 MG tablet  Every 6 hours PRN     09/02/19 2156              This chart was dictated using voice recognition software/Dragon. Despite best efforts to proofread, errors can occur which can change the meaning. Any change was purely unintentional.     Gasper Lloyd 09/02/19 2227    Phineas Semen, MD 09/03/19 [phone]

## 2019-09-02 NOTE — ED Triage Notes (Signed)
Pt presents to ER with c/o testicular pain.  States has had similar in the past, but this is worse.  States was told he has blood collection in his testicle that could turn into cancer and that today the pain is travelling up into his groin and abdomen.  While getting registered, pt fell to floor, possibly hitting head.  PT did not lose consciousness.  Pt alert in triage and able to answer questions appropriately.  Pt cycles from holding breath to hyperventilating during triage.

## 2019-09-02 NOTE — Discharge Instructions (Signed)
Take tramadol for pain

## 2019-09-11 ENCOUNTER — Other Ambulatory Visit: Payer: Self-pay

## 2019-09-11 ENCOUNTER — Emergency Department
Admission: EM | Admit: 2019-09-11 | Discharge: 2019-09-12 | Disposition: A | Discharge status: Home / Self Care | Payer: Self-pay | Attending: Emergency Medicine | Admitting: Emergency Medicine

## 2019-09-11 ENCOUNTER — Emergency Department: Payer: Self-pay

## 2019-09-11 DIAGNOSIS — U071 COVID-19: Secondary | ICD-10-CM

## 2019-09-11 DIAGNOSIS — R519 Headache, unspecified: Secondary | ICD-10-CM | POA: Insufficient documentation

## 2019-09-11 DIAGNOSIS — R509 Fever, unspecified: Secondary | ICD-10-CM | POA: Insufficient documentation

## 2019-09-11 DIAGNOSIS — B349 Viral infection, unspecified: Secondary | ICD-10-CM | POA: Insufficient documentation

## 2019-09-11 DIAGNOSIS — R438 Other disturbances of smell and taste: Secondary | ICD-10-CM | POA: Insufficient documentation

## 2019-09-11 DIAGNOSIS — R05 Cough: Secondary | ICD-10-CM | POA: Insufficient documentation

## 2019-09-11 DIAGNOSIS — Z79899 Other long term (current) drug therapy: Secondary | ICD-10-CM | POA: Insufficient documentation

## 2019-09-11 DIAGNOSIS — Z20822 Contact with and (suspected) exposure to covid-19: Secondary | ICD-10-CM | POA: Insufficient documentation

## 2019-09-11 DIAGNOSIS — Z8669 Personal history of other diseases of the nervous system and sense organs: Secondary | ICD-10-CM | POA: Insufficient documentation

## 2019-09-11 LAB — COMPREHENSIVE METABOLIC PANEL
ALT: 50 U/L — ABNORMAL HIGH (ref 0–44)
AST: 35 U/L (ref 15–41)
Albumin: 4.2 g/dL (ref 3.5–5.0)
Alkaline Phosphatase: 71 U/L (ref 38–126)
Anion gap: 10 (ref 5–15)
BUN: 23 mg/dL — ABNORMAL HIGH (ref 6–20)
CO2: 25 mmol/L (ref 22–32)
Calcium: 8.8 mg/dL — ABNORMAL LOW (ref 8.9–10.3)
Chloride: 101 mmol/L (ref 98–111)
Creatinine, Ser: 1.41 mg/dL — ABNORMAL HIGH (ref 0.61–1.24)
GFR calc Af Amer: >60 mL/min (ref >60)
GFR calc non Af Amer: >60 mL/min (ref >60)
Glucose, Bld: 107 mg/dL — ABNORMAL HIGH (ref 70–99)
Potassium: 3.6 mmol/L (ref 3.5–5.1)
Sodium: 136 mmol/L (ref 135–145)
Total Bilirubin: 0.7 mg/dL (ref 0.3–1.2)
Total Protein: 7.3 g/dL (ref 6.5–8.1)

## 2019-09-11 LAB — CBC
HCT: 45.8 % (ref 39.0–52.0)
Hemoglobin: 15.6 g/dL (ref 13.0–17.0)
MCH: 30.8 pg (ref 26.0–34.0)
MCHC: 34.1 g/dL (ref 30.0–36.0)
MCV: 90.3 fL (ref 80.0–100.0)
Platelets: 160 10*3/uL (ref 150–400)
RBC: 5.07 MIL/uL (ref 4.22–5.81)
RDW: 12.8 % (ref 11.5–15.5)
WBC: 5 10*3/uL (ref 4.0–10.5)
nRBC: 0 % (ref 0.0–0.2)

## 2019-09-11 MED ORDER — IBUPROFEN 400 MG PO TABS
600.0000 mg | ORAL_TABLET | Freq: Once | ORAL | Status: AC
  Administered 2019-09-11: 600 mg via ORAL
  Filled 2019-09-11: qty 2

## 2019-09-11 MED ORDER — ONDANSETRON HCL 4 MG/2ML IJ SOLN: 4.0000 mg | Freq: Once | INTRAMUSCULAR | Status: DC

## 2019-09-11 MED ORDER — ACETAMINOPHEN 500 MG PO TABS
1000.0000 mg | ORAL_TABLET | Freq: Once | ORAL | Status: AC
  Administered 2019-09-11: 1000 mg via ORAL
  Filled 2019-09-11: qty 2

## 2019-09-11 MED ORDER — ONDANSETRON 4 MG PO TBDP
4.0000 mg | ORAL_TABLET | Freq: Once | ORAL | Status: AC
  Administered 2019-09-11: 4 mg via ORAL
  Filled 2019-09-11: qty 1

## 2019-09-11 NOTE — ED Notes (Signed)
Pt laying on the floor in lobby at this time in NAD.

## 2019-09-11 NOTE — ED Triage Notes (Addendum)
Patient presents stating he thinks he has the virus. Has known exposure but has not received any vaccines. States nausea and vomiting as well a fever. Also states he has lost his sense of taste

## 2019-09-12 LAB — SARS CORONAVIRUS 2 BY RT PCR (HOSPITAL ORDER, PERFORMED IN ~~LOC~~ HOSPITAL LAB): SARS Coronavirus 2: NEGATIVE

## 2019-09-12 LAB — TROPONIN I (HIGH SENSITIVITY): Troponin I (High Sensitivity): 3 ng/L (ref <18)

## 2019-09-12 MED ORDER — ONDANSETRON 4 MG PO TBDP: 4.0000 mg | ORAL_TABLET | Freq: Three times a day (TID) | ORAL | 0 refills | Status: DC | PRN

## 2019-09-12 NOTE — Discharge Instructions (Signed)
Su prueba de COVID fue negativa, pero sus sntomas son consistentes con una enfermedad viral. Tome ibuprofeno y tylenol para los dolores corporales y la [place]. Tome zofran segn sea necesario para las nuseas y los vmitos. Para estimular su sistema inmunolgico, debe tomar:  Vitamina D3 4.000 IU/da Vitamina C 500-1.000?mg dos veces al da Quercetina 250?mg dos veces al da Zinc 100?mg/da   ____________________________________  Your COVID test was negative but your symptoms are consistent with a viral illness. Take ibuprofen and tylenol for body aches and fever. Take zofran as needed for nausea and vomiting.

## 2019-09-12 NOTE — ED Provider Notes (Signed)
[facility] Emergency Department Provider Note  ____________________________________________  Time seen: Approximately 12:37 AM  I have reviewed the triage vital signs and the nursing notes.   HISTORY  Chief Complaint Covid Exposure (Thinks he has the virus)   HPI Daniel Contreras is a 34 y.o. male with a history of seizures who presents for evaluation of Covid-like symptoms.  Patient reports that his symptoms started 2 days ago.  He is complaining of body aches, fever, chills, headache, cough, loss of taste or smell, shortness of breath, nausea, and vomiting, sore throat.  Patient has been exposed to Covid.  Has not been vaccinated.  The symptoms have been constant and severe in intensity according to him.   Past Medical History:  Diagnosis Date  . Seizures [facility])     Patient Active Problem List   Diagnosis Date Noted  . Seizure ([place]) 02/14/2019  . Scrotal injury, initial encounter 12/15/2017    History reviewed. No pertinent surgical history.  Prior to Admission medications   Medication Sig Start Date End Date Taking? Authorizing Provider  albuterol (VENTOLIN HFA) 108 (90 Base) MCG/ACT inhaler Inhale 2 puffs into the lungs every 6 (six) hours as needed. 04/20/19   Menshew, Dannielle Karvonen, PA-C  levETIRAcetam (KEPPRA) 500 MG tablet Take 1 tablet (500 mg total) by mouth 2 (two) times daily. 02/15/19   Caren Griffins, MD  ondansetron (ZOFRAN ODT) 4 MG disintegrating tablet Take 1 tablet (4 mg total) by mouth every 8 (eight) hours as needed. 04/20/19   Menshew, Dannielle Karvonen, PA-C  predniSONE (DELTASONE) 20 MG tablet Take 3 tabs daily x 2 days; Take 2 tabs daily x 3 days; Take 1 tab daily x 3 days; Take 0.5 tab daily x 4 days 04/21/19   Menshew, Dannielle Karvonen, PA-C    Allergies Patient has no known allergies.  Family History  Problem Relation Age of Onset  . Seizures Neg Hx     Social History Social History   Tobacco Use  . Smoking  status: Never Smoker  . Smokeless tobacco: Never Used  Substance Use Topics  . Alcohol use: No  . Drug use: No    Review of Systems  Constitutional: + fever, body aches Eyes: Negative for visual changes. ENT: + sore throat. Neck: No neck pain  Cardiovascular: Negative for chest pain. Respiratory: + shortness of breath, cough Gastrointestinal: Negative for abdominal pain or diarrhea. + N/V Genitourinary: Negative for dysuria. Musculoskeletal: Negative for back pain. Skin: Negative for rash. Neurological: Negative for headaches, weakness or numbness. Psych: No SI or HI  ____________________________________________   PHYSICAL EXAM:  VITAL SIGNS: ED Triage Vitals  Enc Vitals Group     BP 09/11/19 2000 123/71     Pulse Rate 09/11/19 2000 95     Resp 09/11/19 2000 20     Temp 09/11/19 2000 (!) 100.5 F (38.1 C)     Temp Source 09/11/19 2000 Oral     SpO2 09/11/19 2000 98 %     Weight 09/11/19 2001 153 lb (69.4 kg)     Height 09/11/19 2001 5\' 4"  (1.626 m)     Head Circumference --      Peak Flow --      Pain Score 09/11/19 2000 8     Pain Loc --      Pain Edu? --      Excl. in [place]? --     Constitutional: Alert and oriented. Well appearing and in no  apparent distress. HEENT:      Head: Normocephalic and atraumatic.         Eyes: Conjunctivae are normal. Sclera is non-icteric.       Mouth/Throat: Mucous membranes are moist.       Neck: Supple with no signs of meningismus. Cardiovascular: Regular rate and rhythm. No murmurs, gallops, or rubs. 2+ symmetrical distal pulses are present in all extremities. No JVD. Respiratory: Normal respiratory effort. Lungs are clear to auscultation bilaterally. No wheezes, crackles, or rhonchi.  Gastrointestinal: Soft, non tender, and non distended with positive bowel sounds. No rebound or guarding. Musculoskeletal: No edema, cyanosis, or erythema of extremities. Neurologic: Normal speech and language. Face is symmetric. Moving all  extremities. No gross focal neurologic deficits are appreciated. Skin: Skin is warm, dry and intact. No rash noted. Psychiatric: Mood and affect are normal. Speech and behavior are normal.  ____________________________________________   LABS (all labs ordered are listed, but only abnormal results are displayed)  Labs Reviewed  COMPREHENSIVE METABOLIC PANEL - Abnormal; Notable for the following components:      Result Value   Glucose, Bld 107 (*)    BUN 23 (*)    Creatinine, Ser 1.41 (*)    Calcium 8.8 (*)    ALT 50 (*)    All other components within normal limits  SARS CORONAVIRUS 2 BY RT PCR (HOSPITAL ORDER, PERFORMED IN [facility])  CBC  TROPONIN I (HIGH SENSITIVITY)   ____________________________________________  EKG  ED ECG REPORT I, Nita Sickle, the attending physician, personally viewed and interpreted this ECG.  Normal sinus rhythm, rate of 94, normal intervals, normal axis, no ST elevations or depressions, T wave inversions in inferior leads.  EKG is unchanged from prior. ____________________________________________  RADIOLOGY  I have personally reviewed the images performed during this visit and I agree with the Radiologist's read.   Interpretation by Radiologist:  DG Chest Port 1 View  Result Date: 09/11/2019 CLINICAL DATA:  Shortness of breath. EXAM: PORTABLE CHEST 1 VIEW COMPARISON:  04/20/2019 FINDINGS: The heart size and mediastinal contours are within normal limits. Both lungs are clear. The visualized skeletal structures are unremarkable. IMPRESSION: No active disease. Electronically Signed   By: Katherine Mantle M.D.   On: 09/11/2019 23:36     ____________________________________________   PROCEDURES  Procedure(s) performed:yes .1-3 Lead EKG Interpretation Performed by: Nita Sickle, MD Authorized by: Nita Sickle, MD     Interpretation: normal     ECG rate assessment: normal     Rhythm: sinus rhythm      Ectopy: none     Critical Care performed:  None ____________________________________________   INITIAL IMPRESSION / ASSESSMENT AND PLAN / ED COURSE  34 y.o. male with a history of seizures who presents for evaluation of Covid-like symptoms x 2 days.  Patient with normal work of breathing, normal sats both at rest and with ambulation.  Lowest sat here was 98%.  Low-grade fever but no tachycardia, tachypnea, hypoxia.  Differential diagnosis including Covid versus viral illness versus pneumonia.  EKG showing normal sinus rhythm with S1Q3T3 which is unchanged from prior.  No pleuritic chest pain, no hypoxia, no tachypnea, no tachycardia, PERC negative.  Patient placed on telemetry for close monitoring.  Old medical records reviewed.  Labs showing no signs of sepsis or cardiac ischemia.  Chest x-ray visualized by me with no infiltrate, confirmed by radiology.  Covid PCR negative. Will dc home on supportive care and follow up with his doctor.  _____________________________________________ Please note:  Patient was evaluated in Emergency Department today for the symptoms described in the history of present illness. Patient was evaluated in the context of the global COVID-19 pandemic, which necessitated consideration that the patient might be at risk for infection with the SARS-CoV-2 virus that causes COVID-19. Institutional protocols and algorithms that pertain to the evaluation of patients at risk for COVID-19 are in a state of rapid change based on information released by regulatory bodies including the CDC and federal and state organizations. These policies and algorithms were followed during the patient's care in the ED.  Some ED evaluations and interventions may be delayed as a result of limited staffing during the pandemic.   [place] Controlled Substance Database was reviewed by me. ____________________________________________   FINAL CLINICAL IMPRESSION(S) / ED DIAGNOSES   Final  diagnoses:  Viral illness      NEW MEDICATIONS STARTED DURING THIS VISIT:  ED Discharge Orders    None       Note:  This document was prepared using Dragon voice recognition software and may include unintentional dictation errors.    Don Perking, [place], MD 09/12/19 [phone]

## 2019-11-16 ENCOUNTER — Emergency Department
Admission: EM | Admit: 2019-11-16 | Discharge: 2019-11-16 | Disposition: A | Discharge status: Home / Self Care | Payer: Self-pay | Attending: Emergency Medicine | Admitting: Emergency Medicine

## 2019-11-16 ENCOUNTER — Encounter: Payer: Self-pay | Admitting: Emergency Medicine

## 2019-11-16 ENCOUNTER — Emergency Department: Payer: Self-pay

## 2019-11-16 ENCOUNTER — Other Ambulatory Visit: Payer: Self-pay

## 2019-11-16 DIAGNOSIS — K6289 Other specified diseases of anus and rectum: Secondary | ICD-10-CM | POA: Insufficient documentation

## 2019-11-16 LAB — URINALYSIS, COMPLETE (UACMP) WITH MICROSCOPIC
Bacteria, UA: NONE SEEN
Bilirubin Urine: NEGATIVE
Glucose, UA: NEGATIVE mg/dL
Hgb urine dipstick: NEGATIVE
Ketones, ur: NEGATIVE mg/dL
Leukocytes,Ua: NEGATIVE
Nitrite: NEGATIVE
Protein, ur: NEGATIVE mg/dL
Specific Gravity, Urine: 1.026 (ref 1.005–1.030)
pH: 5 (ref 5.0–8.0)

## 2019-11-16 LAB — COMPREHENSIVE METABOLIC PANEL
ALT: 49 U/L — ABNORMAL HIGH (ref 0–44)
AST: 30 U/L (ref 15–41)
Albumin: 4.8 g/dL (ref 3.5–5.0)
Alkaline Phosphatase: 74 U/L (ref 38–126)
Anion gap: 11 (ref 5–15)
BUN: 23 mg/dL — ABNORMAL HIGH (ref 6–20)
CO2: 26 mmol/L (ref 22–32)
Calcium: 9.6 mg/dL (ref 8.9–10.3)
Chloride: 100 mmol/L (ref 98–111)
Creatinine, Ser: 1.07 mg/dL (ref 0.61–1.24)
GFR calc Af Amer: >60 mL/min (ref >60)
GFR calc non Af Amer: >60 mL/min (ref >60)
Glucose, Bld: 110 mg/dL — ABNORMAL HIGH (ref 70–99)
Potassium: 3.9 mmol/L (ref 3.5–5.1)
Sodium: 137 mmol/L (ref 135–145)
Total Bilirubin: 1.2 mg/dL (ref 0.3–1.2)
Total Protein: 7.5 g/dL (ref 6.5–8.1)

## 2019-11-16 LAB — CBC
HCT: 45.6 % (ref 39.0–52.0)
Hemoglobin: 15.5 g/dL (ref 13.0–17.0)
MCH: 31 pg (ref 26.0–34.0)
MCHC: 34 g/dL (ref 30.0–36.0)
MCV: 91.2 fL (ref 80.0–100.0)
Platelets: 216 10*3/uL (ref 150–400)
RBC: 5 MIL/uL (ref 4.22–5.81)
RDW: 13.3 % (ref 11.5–15.5)
WBC: 5.8 10*3/uL (ref 4.0–10.5)
nRBC: 0 % (ref 0.0–0.2)

## 2019-11-16 LAB — TYPE AND SCREEN
ABO/RH(D): O POS
Antibody Screen: NEGATIVE

## 2019-11-16 MED ORDER — IOHEXOL 300 MG/ML  SOLN
100.0000 mL | Freq: Once | INTRAMUSCULAR | Status: AC | PRN
  Administered 2019-11-16: 100 mL via INTRAVENOUS
  Filled 2019-11-16: qty 100

## 2019-11-16 NOTE — ED Triage Notes (Signed)
C?O rectal pain and Hemorid  bleeding since Sunday.  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

## 2019-11-16 NOTE — ED Provider Notes (Signed)
[facility] Emergency Department Provider Note  ____________________________________________   First MD Initiated Contact with Patient 11/16/19 1401     (approximate)  I have reviewed the triage vital signs and the nursing notes.   HISTORY  Chief Complaint Rectal Pain and Rectal Bleeding   HPI Daniel Contreras is a 34 y.o. male has a past medical history of seizure disorder and remote history of internal hemorrhoids who presents for assessment of 3 days of severe rectal pain associate with some blood mixed in his stool.  He denies any abdominal pain, vomiting, back pain, dysuria, testicular or scrotal pain, chest pain, cough, shortness of breath, or other acute complaints.  States pooping makes the pain worse but states he has been pooping regularly.  He denies constipation.  Denies any receptive anal intercourse or insertion of foreign bodies.  No recent trauma.         Past Medical History:  Diagnosis Date  . Seizures [facility])     Patient Active Problem List   Diagnosis Date Noted  . Seizure (HCC) 02/14/2019  . Scrotal injury, initial encounter 12/15/2017    History reviewed. No pertinent surgical history.  Prior to Admission medications   Medication Sig Start Date End Date Taking? Authorizing Provider  albuterol (VENTOLIN HFA) 108 (90 Base) MCG/ACT inhaler Inhale 2 puffs into the lungs every 6 (six) hours as needed. 04/20/19   Menshew, Charlesetta Ivory, PA-C  levETIRAcetam (KEPPRA) 500 MG tablet Take 1 tablet (500 mg total) by mouth 2 (two) times daily. 02/15/19   Leatha Gilding, MD    Allergies Patient has no known allergies.  Family History  Problem Relation Age of Onset  . Seizures Neg Hx     Social History Social History   Tobacco Use  . Smoking status: Never Smoker  . Smokeless tobacco: Never Used  Substance Use Topics  . Alcohol use: No  . Drug use: No    Review of Systems  Review of Systems  Constitutional: Negative  for chills and fever.  HENT: Negative for sore throat.   Eyes: Negative for pain.  Respiratory: Negative for cough and stridor.   Cardiovascular: Negative for chest pain.  Gastrointestinal: Positive for blood in stool. Negative for vomiting.  Skin: Negative for rash.  Neurological: Negative for seizures, loss of consciousness and headaches.  Psychiatric/Behavioral: Negative for suicidal ideas.  All other systems reviewed and are negative.     ____________________________________________   PHYSICAL EXAM:  VITAL SIGNS: ED Triage Vitals  Enc Vitals Group     BP 11/16/19 1005 125/87     Pulse Rate 11/16/19 1005 64     Resp 11/16/19 1005 16     Temp 11/16/19 1005 99.3 F (37.4 C)     Temp Source 11/16/19 1005 Oral     SpO2 11/16/19 1005 100 %     Weight 11/16/19 1006 153 lb (69.4 kg)     Height 11/16/19 1006 5\' 4"  (1.626 m)     Head Circumference --      Peak Flow --      Pain Score 11/16/19 1005 10     Pain Loc --      Pain Edu? --      Excl. in GC? --    Vitals:   11/16/19 1301 11/16/19 1602  BP: 118/82 123/80  Pulse: 60 63  Resp: 16 18  Temp: 98.6 F (37 C)   SpO2: 99% 98%   Physical Exam Vitals and nursing  note reviewed. Exam conducted with a chaperone present.  Constitutional:      Appearance: He is well-developed.  HENT:     Head: Normocephalic and atraumatic.     Right Ear: External ear normal.     Left Ear: External ear normal.     Nose: Nose normal.     Mouth/Throat:     Mouth: Mucous membranes are moist.  Eyes:     Conjunctiva/sclera: Conjunctivae normal.  Cardiovascular:     Rate and Rhythm: Normal rate and regular rhythm.     Heart sounds: No murmur heard.   Pulmonary:     Effort: Pulmonary effort is normal. No respiratory distress.     Breath sounds: Normal breath sounds.  Abdominal:     Palpations: Abdomen is soft.     Tenderness: There is no abdominal tenderness.  Genitourinary:    Rectum: Guaiac result negative. Tenderness present. No  anal fissure, external hemorrhoid or internal hemorrhoid.  Musculoskeletal:     Cervical back: Neck supple.  Skin:    General: Skin is warm and dry.     Capillary Refill: Capillary refill takes less than 2 seconds.  Neurological:     Mental Status: He is alert and oriented to person, place, and time.  Psychiatric:        Mood and Affect: Mood normal.      ____________________________________________   LABS (all labs ordered are listed, but only abnormal results are displayed)  Labs Reviewed  COMPREHENSIVE METABOLIC PANEL - Abnormal; Notable for the following components:      Result Value   Glucose, Bld 110 (*)    BUN 23 (*)    ALT 49 (*)    All other components within normal limits  URINALYSIS, COMPLETE (UACMP) WITH MICROSCOPIC - Abnormal; Notable for the following components:   Color, Urine YELLOW (*)    APPearance CLEAR (*)    All other components within normal limits  CBC  POC OCCULT BLOOD, ED  TYPE AND SCREEN   ____________________________________________  RADIOLOGY   Official radiology report(s): CT PELVIS W CONTRAST  Result Date: 11/16/2019 CLINICAL DATA:  Rectal pain and bleeding, history of hemorrhoids EXAM: CT PELVIS WITH CONTRAST TECHNIQUE: Multidetector CT imaging of the pelvis was performed using the standard protocol following the bolus administration of intravenous contrast. CONTRAST:  OMNIPAQUE IOHEXOL 300 MG/ML  SOLN COMPARISON:  12/21/2017 FINDINGS: Urinary Tract:  Distal ureters and bladder are unremarkable. Bowel: No bowel obstruction or ileus. No bowel wall thickening or inflammatory change. Normal appendix right lower quadrant. Vascular/Lymphatic: No pathologically enlarged lymph nodes. No significant vascular abnormality seen. Reproductive:  Prostate is unremarkable. Other:  No free fluid or free gas.  No abdominal wall hernia. Musculoskeletal: No acute or destructive bony abnormalities. Minimal heterotopic ossification within the quadriceps  musculature of the proximal right thigh, likely related to prior trauma. Reconstructed images demonstrate no additional findings. IMPRESSION: 1. No acute intrapelvic process. Electronically Signed   By: Sharlet Salina M.D.   On: 11/16/2019 15:45    ____________________________________________   PROCEDURES  Procedure(s) performed (including Critical Care):  Procedures   ____________________________________________   INITIAL IMPRESSION / ASSESSMENT AND PLAN / ED COURSE        Patient presents with [place] to history exam for evaluation of pain with defecation and report of blood mixed in his stool the last couple of days.  Afebrile hemodynamically stable arrival.  Exam as above.  No fissures or external hemorrhoids visualized on exam.  No palpable internal  hemorrhoids.  Guaiac negative.  No evidence of abscess on CT.  UA is not consistent with cystitis or infected urine.  Unclear etiology for patient's symptoms although given stable vital signs, reassuring exam, and reassuring work-up I do believe he is safe for discharge with plan for close outpatient follow-up.  Discharged stable condition.  Strict return precautions advised and discussed.          ____________________________________________   FINAL CLINICAL IMPRESSION(S) / ED DIAGNOSES  Final diagnoses:  Rectal pain     ED Discharge Orders    None       Note:  This document was prepared using Dragon voice recognition software and may include unintentional dictation errors.   Gilles Chiquito, MD 11/16/19 [phone]

## 2019-11-16 NOTE — ED Notes (Signed)
See triage note  Presents with rectal pain and bleeding  States he has a hx of hemorrhoids in past about 6 years ago   States he feels like the are internal

## 2019-12-08 ENCOUNTER — Other Ambulatory Visit: Payer: Self-pay

## 2019-12-08 ENCOUNTER — Encounter: Payer: Self-pay | Admitting: Emergency Medicine

## 2019-12-08 ENCOUNTER — Emergency Department
Admission: EM | Admit: 2019-12-08 | Discharge: 2019-12-08 | Disposition: A | Discharge status: Home / Self Care | Payer: HRSA Program | Attending: Emergency Medicine | Admitting: Emergency Medicine

## 2019-12-08 DIAGNOSIS — J029 Acute pharyngitis, unspecified: Secondary | ICD-10-CM | POA: Diagnosis present

## 2019-12-08 DIAGNOSIS — Z79899 Other long term (current) drug therapy: Secondary | ICD-10-CM | POA: Diagnosis not present

## 2019-12-08 DIAGNOSIS — U071 COVID-19: Secondary | ICD-10-CM | POA: Insufficient documentation

## 2019-12-08 LAB — GROUP A STREP BY PCR: Group A Strep by PCR: NOT DETECTED

## 2019-12-08 LAB — SARS CORONAVIRUS 2 BY RT PCR (HOSPITAL ORDER, PERFORMED IN ~~LOC~~ HOSPITAL LAB): SARS Coronavirus 2: POSITIVE — AB

## 2019-12-08 MED ORDER — BENZONATATE 100 MG PO CAPS: 100.0000 mg | ORAL_CAPSULE | Freq: Three times a day (TID) | ORAL | 0 refills | Status: AC | PRN

## 2019-12-08 MED ORDER — ALBUTEROL SULFATE HFA 108 (90 BASE) MCG/ACT IN AERS: 2.0000 | INHALATION_SPRAY | Freq: Four times a day (QID) | RESPIRATORY_TRACT | 2 refills | Status: DC | PRN

## 2019-12-08 MED ORDER — ONDANSETRON 4 MG PO TBDP: 4.0000 mg | ORAL_TABLET | Freq: Three times a day (TID) | ORAL | 0 refills | Status: AC | PRN

## 2019-12-08 NOTE — ED Provider Notes (Signed)
Emergency Department Provider Note  ____________________________________________  Time seen: Approximately 10:50 PM  I have reviewed the triage vital signs and the nursing notes.   HISTORY  Chief Complaint Sore Throat   Historian Patient     HPI Daniel Contreras is a 34 y.o. male presents to the emergency department with headache, fever and pharyngitis for the past 2 days.  Patient son has experienced similar symptoms.  No rhinorrhea, nasal congestion or nonproductive cough.  No increased work of breathing at home.  No chest pain, chest tightness or abdominal pain.  No other alleviating measures have been attempted.   Past Medical History:  Diagnosis Date  . Seizures (HCC)      Immunizations up to date:  Yes.     Past Medical History:  Diagnosis Date  . Seizures [facility])     Patient Active Problem List   Diagnosis Date Noted  . Seizure (HCC) 02/14/2019  . Scrotal injury, initial encounter 12/15/2017    History reviewed. No pertinent surgical history.  Prior to Admission medications   Medication Sig Start Date End Date Taking? Authorizing Provider  albuterol (VENTOLIN HFA) 108 (90 Base) MCG/ACT inhaler Inhale 2 puffs into the lungs every 6 (six) hours as needed for wheezing or shortness of breath. 12/08/19   Orvil Feil, PA-C  benzonatate (TESSALON PERLES) 100 MG capsule Take 1 capsule (100 mg total) by mouth 3 (three) times daily as needed for up to 7 days for cough. 12/08/19 12/15/19  Orvil Feil, PA-C  levETIRAcetam (KEPPRA) 500 MG tablet Take 1 tablet (500 mg total) by mouth 2 (two) times daily. 02/15/19   Leatha Gilding, MD  ondansetron (ZOFRAN ODT) 4 MG disintegrating tablet Take 1 tablet (4 mg total) by mouth every 8 (eight) hours as needed for up to 5 days. 12/08/19 12/13/19  Orvil Feil, PA-C    Allergies Patient has no known allergies.  Family History  Problem Relation Age of Onset  . Seizures Neg Hx     Social History Social History    Tobacco Use  . Smoking status: Never Smoker  . Smokeless tobacco: Never Used  Substance Use Topics  . Alcohol use: No  . Drug use: No      Review of Systems  Constitutional: Patient has fever.  Eyes: No visual changes. No discharge ENT: Patient has congestion.  Cardiovascular: no chest pain. Respiratory: Patient has cough.  Gastrointestinal: No abdominal pain.  No nausea, no vomiting. Patient had diarrhea.  Genitourinary: Negative for dysuria. No hematuria Musculoskeletal: Patient has myalgias.  Skin: Negative for rash, abrasions, lacerations, ecchymosis. Neurological: Patient has headache, no focal weakness or numbness.    ____________________________________________   PHYSICAL EXAM:  VITAL SIGNS: ED Triage Vitals  Enc Vitals Group     BP 12/08/19 2039 128/81     Pulse Rate 12/08/19 2039 83     Resp 12/08/19 2039 18     Temp 12/08/19 2039 98.5 F (36.9 C)     Temp Source 12/08/19 2039 Oral     SpO2 12/08/19 2039 99 %     Weight --      Height 12/08/19 2041 5\' 3"  (1.6 m)     Head Circumference --      Peak Flow --      Pain Score 12/08/19 2041 0     Pain Loc --      Pain Edu? --      Excl. in GC? --      Constitutional:  Alert and oriented. Patient is lying supine. Eyes: Conjunctivae are normal. PERRL. EOMI. Head: Atraumatic. ENT:      Ears: Tympanic membranes are mildly injected with mild effusion bilaterally.       Nose: No congestion/rhinnorhea.      Mouth/Throat: Mucous membranes are moist. Posterior pharynx is mildly erythematous.  Hematological/Lymphatic/Immunilogical: No cervical lymphadenopathy.  Cardiovascular: Normal rate, regular rhythm. Normal S1 and S2.  Good peripheral circulation. Respiratory: Normal respiratory effort without tachypnea or retractions. Lungs CTAB. Good air entry to the bases with no decreased or absent breath sounds. Gastrointestinal: Bowel sounds 4 quadrants. Soft and nontender to palpation. No guarding or rigidity. No  palpable masses. No distention. No CVA tenderness. Musculoskeletal: Full range of motion to all extremities. No gross deformities appreciated. Neurologic:  Normal speech and language. No gross focal neurologic deficits are appreciated.  Skin:  Skin is warm, dry and intact. No rash noted. Psychiatric: Mood and affect are normal. Speech and behavior are normal. Patient exhibits appropriate insight and judgement.   ____________________________________________   LABS (all labs ordered are listed, but only abnormal results are displayed)  Labs Reviewed  SARS CORONAVIRUS 2 BY RT PCR (HOSPITAL ORDER, PERFORMED IN [facility]) - Abnormal; Notable for the following components:      Result Value   SARS Coronavirus 2 POSITIVE (*)    All other components within normal limits  GROUP A STREP BY PCR   ____________________________________________  EKG   ____________________________________________  RADIOLOGY   No results found.  ____________________________________________    PROCEDURES  Procedure(s) performed:     Procedures     Medications - No data to display   ____________________________________________   INITIAL IMPRESSION / ASSESSMENT AND PLAN / ED COURSE  Pertinent labs & imaging results that were available during my care of the patient were reviewed by me and considered in my medical decision making (see chart for details).  Clinical Course as of Dec 07 2248  Thu Dec 08, 2019  2224 SARS Coronavirus 2(!): POSITIVE [JW]    Clinical Course User Index [JW] Orvil Feil, PA-C     Assessment and plan COVID-28 34 year old male presents to the emergency department with pharyngitis and headache for the past 2 days.  Vital signs were reassuring in triage.  On physical exam, patient had no increased work of breathing.  You tested positive for COVID-19 in the emergency department.  Rest and hydration were encouraged.  He was discharged with Sondra Come, Zofran and albuterol inhaler.  Quarantine precautions were given.  Return precautions were given to return with new or worsening symptoms.   ____________________________________________  FINAL CLINICAL IMPRESSION(S) / ED DIAGNOSES  Final diagnoses:  COVID-19      NEW MEDICATIONS STARTED DURING THIS VISIT:  ED Discharge Orders         Ordered    albuterol (VENTOLIN HFA) 108 (90 Base) MCG/ACT inhaler  Every 6 hours PRN        12/08/19 2224    ondansetron (ZOFRAN ODT) 4 MG disintegrating tablet  Every 8 hours PRN        12/08/19 2224    benzonatate (TESSALON PERLES) 100 MG capsule  3 times daily PRN        12/08/19 2224              This chart was dictated using voice recognition software/Dragon. Despite best efforts to proofread, errors can occur which can change the meaning. Any change was purely unintentional.  Pia Mau Bowlus, PA-C 12/08/19 2253    Phineas Semen, MD 12/08/19 [phone]

## 2019-12-08 NOTE — ED Notes (Signed)
Pia Mau, PA-C aware of Covid positive test.

## 2019-12-08 NOTE — ED Triage Notes (Signed)
Patient ambulatory to triage with steady gait, without difficulty or distress noted; pt reports fever & sore throat today; here with son with same symptoms

## 2019-12-09 ENCOUNTER — Telehealth: Payer: Self-pay | Admitting: Family

## 2019-12-09 NOTE — Telephone Encounter (Signed)
Called to Discuss with patient about Covid symptoms and the use of the monoclonal antibody infusion for those with mild to moderate Covid symptoms and at a high risk of hospitalization.     Pt appears to qualify for this infusion due to co-morbid conditions and/or a member of an at-risk group in accordance with the FDA Emergency Use Authorization.    Mr. Daniel Contreras was seen in the ED on 12/08/19 with sore throat, headache, and fever of 2 day duration. Risk factors are social and appears to qualify for treatment with Regeneron. I called and received a message that his voicemail was not set up and does not have a MyChart activated.   Marcos Eke, NP 12/09/2019 9:33 AM

## 2019-12-18 ENCOUNTER — Other Ambulatory Visit: Payer: Self-pay

## 2019-12-18 ENCOUNTER — Emergency Department
Admission: EM | Admit: 2019-12-18 | Discharge: 2019-12-18 | Disposition: A | Discharge status: Home / Self Care | Payer: Self-pay | Attending: Emergency Medicine | Admitting: Emergency Medicine

## 2019-12-18 DIAGNOSIS — R569 Unspecified convulsions: Secondary | ICD-10-CM | POA: Insufficient documentation

## 2019-12-18 DIAGNOSIS — Z8619 Personal history of other infectious and parasitic diseases: Secondary | ICD-10-CM | POA: Insufficient documentation

## 2019-12-18 LAB — URINALYSIS, COMPLETE (UACMP) WITH MICROSCOPIC
Bacteria, UA: NONE SEEN
Bilirubin Urine: NEGATIVE
Glucose, UA: NEGATIVE mg/dL
Hgb urine dipstick: NEGATIVE
Ketones, ur: NEGATIVE mg/dL
Leukocytes,Ua: NEGATIVE
Nitrite: NEGATIVE
Protein, ur: NEGATIVE mg/dL
Specific Gravity, Urine: 1.033 — ABNORMAL HIGH (ref 1.005–1.030)
Squamous Epithelial / HPF: NONE SEEN (ref 0–5)
pH: 5 (ref 5.0–8.0)

## 2019-12-18 LAB — CBC
HCT: 43.8 % (ref 39.0–52.0)
Hemoglobin: 15.1 g/dL (ref 13.0–17.0)
MCH: 31.2 pg (ref 26.0–34.0)
MCHC: 34.5 g/dL (ref 30.0–36.0)
MCV: 90.5 fL (ref 80.0–100.0)
Platelets: 193 10*3/uL (ref 150–400)
RBC: 4.84 MIL/uL (ref 4.22–5.81)
RDW: 12.5 % (ref 11.5–15.5)
WBC: 6.4 10*3/uL (ref 4.0–10.5)
nRBC: 0 % (ref 0.0–0.2)

## 2019-12-18 LAB — COMPREHENSIVE METABOLIC PANEL
ALT: 51 U/L — ABNORMAL HIGH (ref 0–44)
AST: 33 U/L (ref 15–41)
Albumin: 3.8 g/dL (ref 3.5–5.0)
Alkaline Phosphatase: 69 U/L (ref 38–126)
Anion gap: 8 (ref 5–15)
BUN: 24 mg/dL — ABNORMAL HIGH (ref 6–20)
CO2: 27 mmol/L (ref 22–32)
Calcium: 8.4 mg/dL — ABNORMAL LOW (ref 8.9–10.3)
Chloride: 103 mmol/L (ref 98–111)
Creatinine, Ser: 1.02 mg/dL (ref 0.61–1.24)
GFR calc Af Amer: >60 mL/min (ref >60)
GFR calc non Af Amer: >60 mL/min (ref >60)
Glucose, Bld: 113 mg/dL — ABNORMAL HIGH (ref 70–99)
Potassium: 3.7 mmol/L (ref 3.5–5.1)
Sodium: 138 mmol/L (ref 135–145)
Total Bilirubin: 0.7 mg/dL (ref 0.3–1.2)
Total Protein: 6.7 g/dL (ref 6.5–8.1)

## 2019-12-18 MED ORDER — LEVETIRACETAM IN NACL 1500 MG/100ML IV SOLN
1500.0000 mg | Freq: Once | INTRAVENOUS | Status: AC
  Administered 2019-12-18: 1500 mg via INTRAVENOUS
  Filled 2019-12-18: qty 100

## 2019-12-18 MED ORDER — LEVETIRACETAM 500 MG PO TABS: 500.0000 mg | ORAL_TABLET | Freq: Two times a day (BID) | ORAL | 1 refills | Status: DC

## 2019-12-18 NOTE — ED Triage Notes (Signed)
Pt from home via [facility] with c/o seizures. Pt has hx of seizures, takes kepra. Pt is postictal through transport. Pt received 2 versed.  EMS VS: HR 69, 100% RA, 113/79. CBG: 120.  Pt is A&O at this time.

## 2019-12-18 NOTE — ED Provider Notes (Signed)
[facility] Emergency Department Provider Note  ____________________________________________   First MD Initiated Contact with Patient 12/18/19 0014     (approximate)  I have reviewed the triage vital signs and the nursing notes.   HISTORY  Chief Complaint Seizures    HPI Daniel Contreras is a 34 y.o. male with history of recently diagnosed COVID-19 on 12/08/2019 and seizure disorder presents to the emergency department via EMS following witnessed generalized tonic-clonic seizure at home lasting approximately 5 minutes.   Patient had an additional witnessed tonic-clonic seizure via by EMS for which he was given 2 mg of Versed.  Patient does admit to not being compliant with Keppra times "years".       Past Medical History:  Diagnosis Date  . Seizures [facility])     Patient Active Problem List   Diagnosis Date Noted  . Seizure (HCC) 02/14/2019  . Scrotal injury, initial encounter 12/15/2017    History reviewed. No pertinent surgical history.  Prior to Admission medications   Medication Sig Start Date End Date Taking? Authorizing Provider  albuterol (VENTOLIN HFA) 108 (90 Base) MCG/ACT inhaler Inhale 2 puffs into the lungs every 6 (six) hours as needed for wheezing or shortness of breath. 12/08/19   Orvil Feil, PA-C  levETIRAcetam (KEPPRA) 500 MG tablet Take 1 tablet (500 mg total) by mouth 2 (two) times daily. 02/15/19   Leatha Gilding, MD    Allergies Patient has no known allergies.  Family History  Problem Relation Age of Onset  . Seizures Neg Hx     Social History Social History   Tobacco Use  . Smoking status: Never Smoker  . Smokeless tobacco: Never Used  Substance Use Topics  . Alcohol use: No  . Drug use: No    Review of Systems Constitutional: No fever/chills Eyes: No visual changes. ENT: No sore throat. Cardiovascular: Denies chest pain. Respiratory: Denies shortness of breath. Gastrointestinal: No abdominal pain.   No nausea, no vomiting.  No diarrhea.  No constipation. Genitourinary: Negative for dysuria. Musculoskeletal: Negative for neck pain.  Negative for back pain. Integumentary: Negative for rash. Neurological: NegaPositive for seizure____________________________________________   PHYSICAL EXAM:  VITAL SIGNS: ED Triage Vitals  Enc Vitals Group     BP --      Pulse --      Resp --      Temp 12/18/19 0023 98.1 F (36.7 C)     Temp Source 12/18/19 0023 Oral     SpO2 --      Weight 12/18/19 0017 68.9 kg (152 lb)     Height 12/18/19 0017 1.6 m (5\' 3" )     Head Circumference --      Peak Flow --      Pain Score 12/18/19 0016 6     Pain Loc --      Pain Edu? --      Excl. in GC? --     Constitutional: Alert and oriented.  Eyes: Conjunctivae are normal.  Head: Atraumatic Mouth/Throat: Patient is wearing a mask. Neck: No stridor.  No meningeal signs.   Cardiovascular: Normal rate, regular rhythm. Good peripheral circulation. Grossly normal heart sounds. Respiratory: Normal respiratory effort.  No retractions. Gastrointestinal: Soft and nontender. No distention.  Musculoskeletal: No lower extremity tenderness nor edema. No gross deformities of extremities. Neurologic:  Normal speech and language. No gross focal neurologic deficits are appreciated.  Skin:  Skin is warm, dry and intact. Psychiatric: Mood and affect are normal. Speech  and behavior are normal.  ____________________________________________   LABS (all labs ordered are listed, but only abnormal results are displayed)  Labs Reviewed  COMPREHENSIVE METABOLIC PANEL - Abnormal; Notable for the following components:      Result Value   Glucose, Bld 113 (*)    BUN 24 (*)    Calcium 8.4 (*)    ALT 51 (*)    All other components within normal limits  URINALYSIS, COMPLETE (UACMP) WITH MICROSCOPIC - Abnormal; Notable for the following components:   Color, Urine YELLOW (*)    APPearance CLEAR (*)    Specific Gravity, Urine  1.033 (*)    All other components within normal limits  CBC   ____________________________________________  EKG  ED ECG REPORT I, [place] N Krishana Lutze, the attending physician, personally viewed and interpreted this ECG.   Date: 12/18/2019  EKG Time: 12:20 AM  Rate: 63  Rhythm: Normal sinus rhythm  Axis: Normal  Intervals: Normal  ST&T Change: None  _______________________________________  Procedures   ____________________________________________   INITIAL IMPRESSION / MDM / ASSESSMENT AND PLAN / ED COURSE  As part of my medical decision making, I reviewed the following data within the electronic MEDICAL RECORD NUMBER   34 year old male presented with above-stated history and physical exam following witnessed generalized tonic-clonic seizure at home.  No seizure activity in the emergency department.  Patient observed for 6 hours.  Patient did receive Keppra 1500 mg IV.  Patient will be prescribed Keppra 500 mg twice daily. ____________________________________________  FINAL CLINICAL IMPRESSION(S) / ED DIAGNOSES  Final diagnoses:  Seizure (HCC)     MEDICATIONS GIVEN DURING THIS VISIT:  Medications  levETIRAcetam (KEPPRA) IVPB 1500 mg/ 100 mL premix (1,500 mg Intravenous New Bag/Given 12/18/19 0037)     ED Discharge Orders    None      *Please note:  Daniel Contreras was evaluated in Emergency Department on 12/18/2019 for the symptoms described in the history of present illness. He was evaluated in the context of the global COVID-19 pandemic, which necessitated consideration that the patient might be at risk for infection with the SARS-CoV-2 virus that causes COVID-19. Institutional protocols and algorithms that pertain to the evaluation of patients at risk for COVID-19 are in a state of rapid change based on information released by regulatory bodies including the CDC and federal and state organizations. These policies and algorithms were followed during the patient's care  in the ED.  Some ED evaluations and interventions may be delayed as a result of limited staffing during and after the pandemic.*  Note:  This document was prepared using Dragon voice recognition software and may include unintentional dictation errors.   Darci Current, MD 12/18/19 [phone]

## 2020-03-12 IMAGING — CR DG SINUSES COMPLETE 3+V
1 series · 4 of 4 positions shown · non-contrast
Comparison: None.

CLINICAL DATA: Pain and sore throat

EXAM:
PARANASAL SINUSES - COMPLETE 3 + VIEW

[Series 1: dg sinuses complete · 0.14mm/px · 4 of 4 slices shown]
[im 1/4]
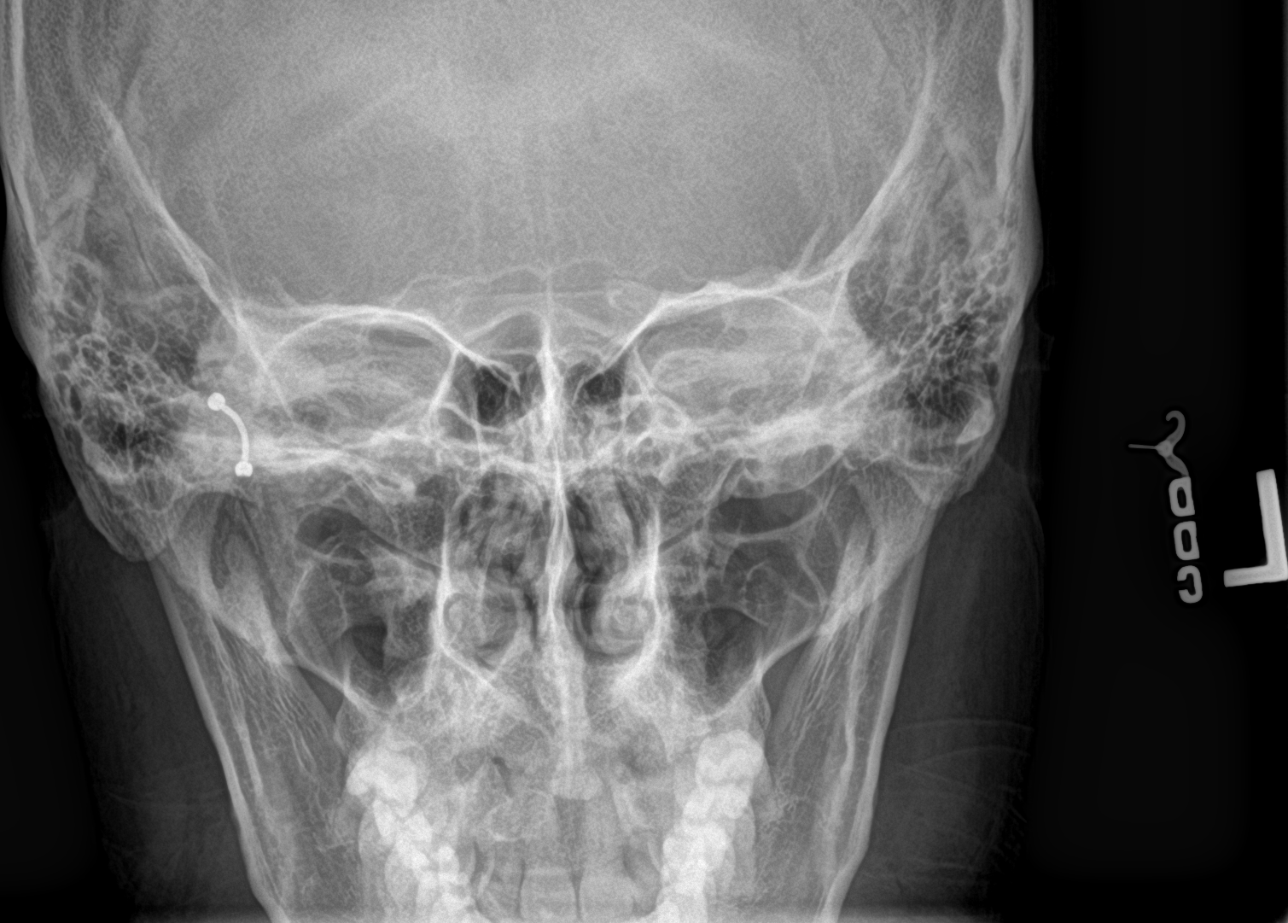
[im 2/4]
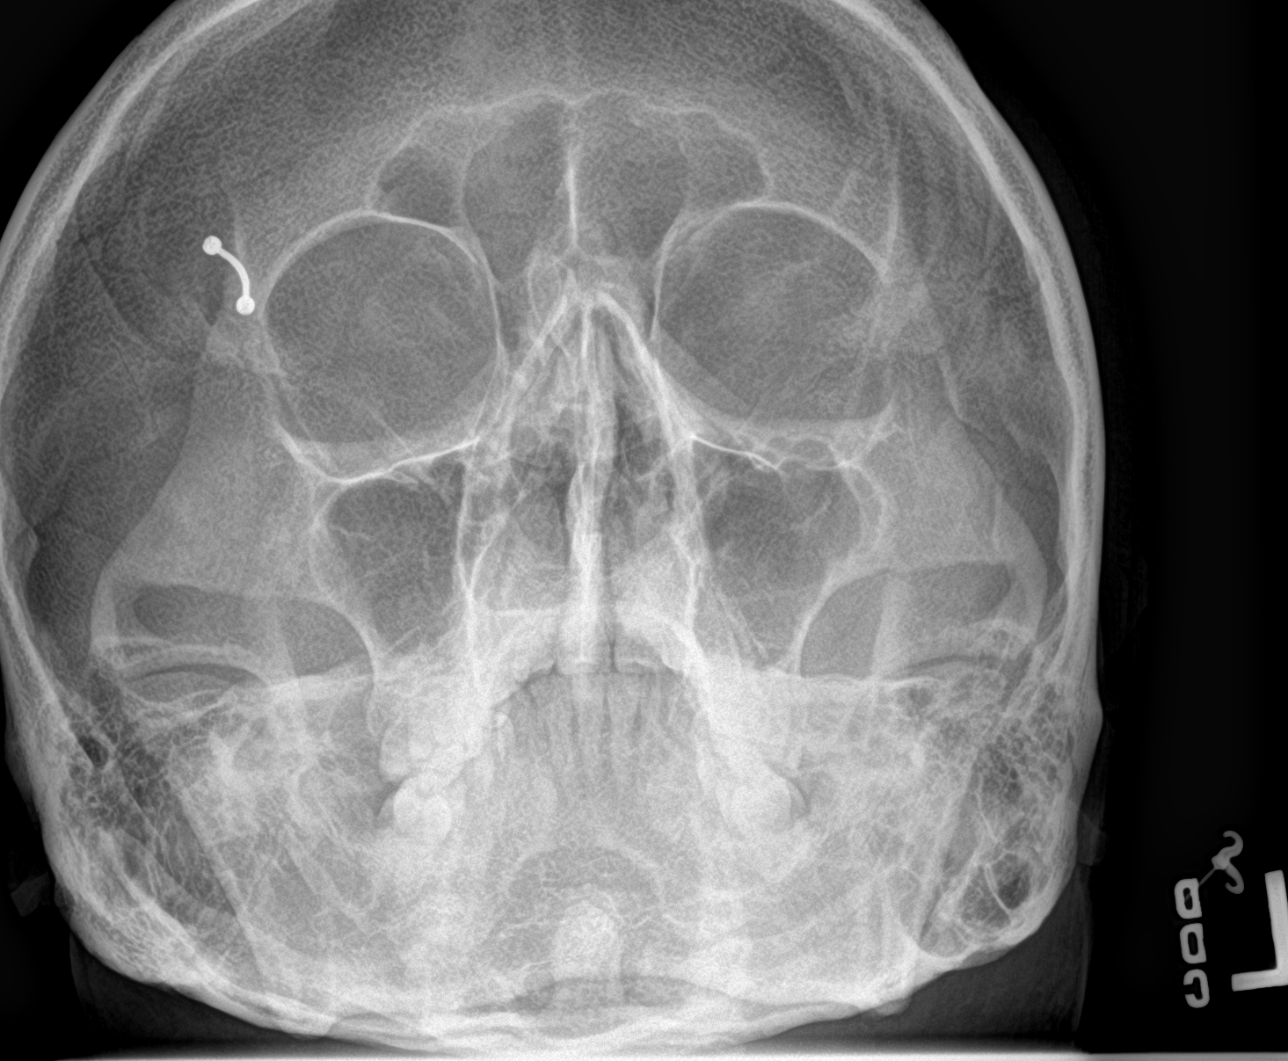
[im 3/4]
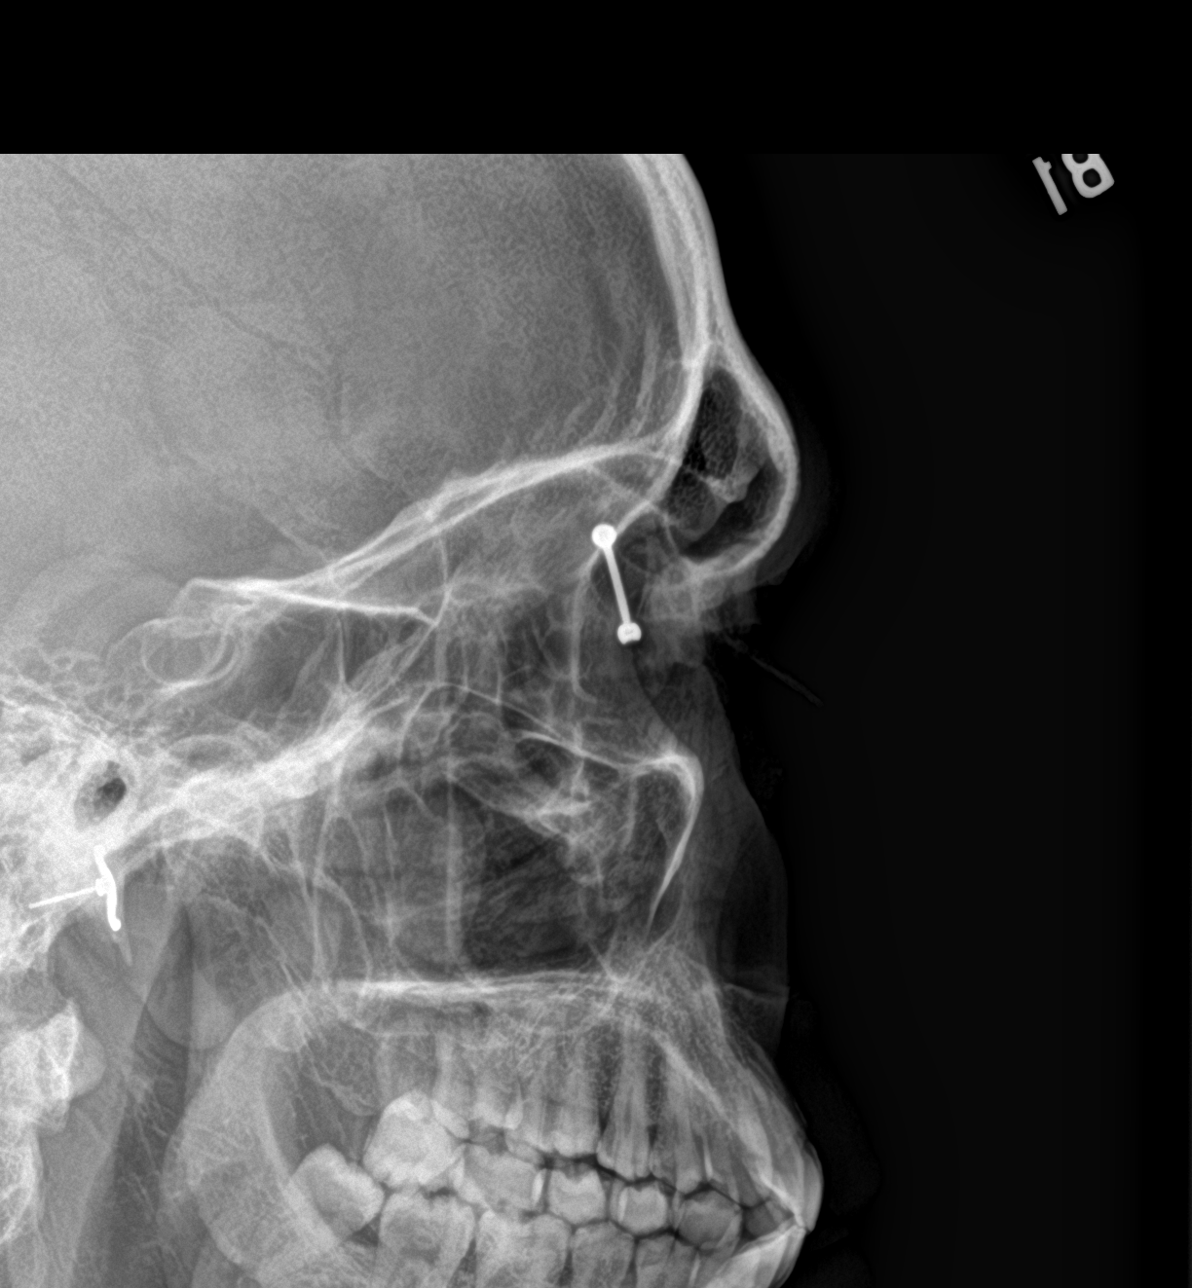
[im 4/4]
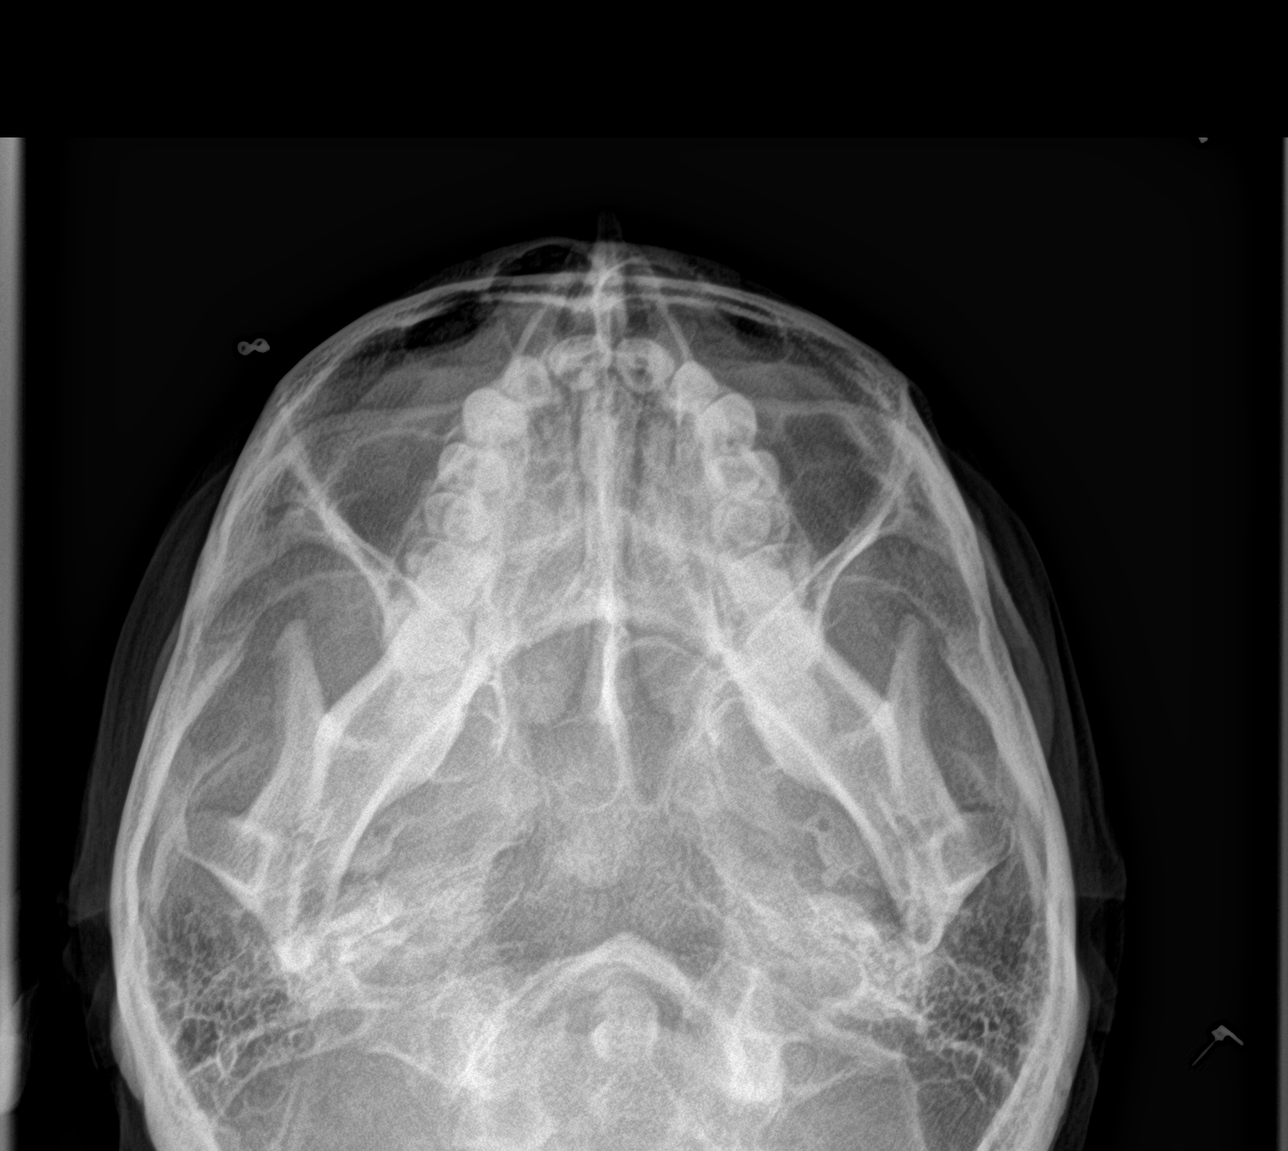

[4 of 4 positions shown; findings below may reference images not displayed]

FINDINGS: Water's, lateral, Ieiem, and submentovertex images were obtained.
Paranasal sinuses are clear. No air-fluid level. No bony destruction
or expansion. Mastoid air cells appear clear. Nasal septum is
midline.
IMPRESSION: Paranasal sinuses and mastoids appear clear.

## 2021-01-20 ENCOUNTER — Other Ambulatory Visit: Payer: Self-pay

## 2021-01-20 ENCOUNTER — Encounter: Payer: Self-pay | Admitting: Emergency Medicine

## 2021-01-20 ENCOUNTER — Emergency Department
Admission: EM | Admit: 2021-01-20 | Discharge: 2021-01-20 | Disposition: A | Discharge status: Home / Self Care | Payer: Self-pay | Attending: Emergency Medicine | Admitting: Emergency Medicine

## 2021-01-20 DIAGNOSIS — W57XXXA Bitten or stung by nonvenomous insect and other nonvenomous arthropods, initial encounter: Secondary | ICD-10-CM | POA: Insufficient documentation

## 2021-01-20 DIAGNOSIS — Z79899 Other long term (current) drug therapy: Secondary | ICD-10-CM | POA: Insufficient documentation

## 2021-01-20 DIAGNOSIS — S60464A Insect bite (nonvenomous) of right ring finger, initial encounter: Secondary | ICD-10-CM | POA: Insufficient documentation

## 2021-01-20 NOTE — ED Triage Notes (Addendum)
Pt arrived via POV with reports of bite to R ring finger while sleeping around 956pm per pt.  Pt states when he felt the bite his finger started bleeding. Pt also c/o R arm pain. Bleeding controlled at this time.

## 2021-01-20 NOTE — ED Provider Notes (Signed)
[facility] Emergency Department Provider Note  ____________________________________________  Time seen: Approximately 6:03 AM  I have reviewed the triage vital signs and the nursing notes.   HISTORY  Chief Complaint Insect Bite   HPI Daniel Contreras is a 35 y.o. male who presents for evaluation of an insect bite.  Patient reports that he was laying in bed around 9:56 PM last night when he felt something bit him on his right ring finger.  When he looked and noticed some blood in the area but did not see an insect.  He started feeling pain in the finger radiating all the way up to the shoulder.  He is afraid he might have been a spider.  He denies chest pain or shortness of breath, throat closing sensation, angioedema, vomiting or diarrhea.   Past Medical History:  Diagnosis Date   Seizures [facility])     Patient Active Problem List   Diagnosis Date Noted   Seizure (HCC) 02/14/2019   Scrotal injury, initial encounter 12/15/2017    History reviewed. No pertinent surgical history.  Prior to Admission medications   Medication Sig Start Date End Date Taking? Authorizing Provider  albuterol (VENTOLIN HFA) 108 (90 Base) MCG/ACT inhaler Inhale 2 puffs into the lungs every 6 (six) hours as needed for wheezing or shortness of breath. 12/08/19   Orvil Feil, PA-C  levETIRAcetam (KEPPRA) 500 MG tablet Take 1 tablet (500 mg total) by mouth 2 (two) times daily. 02/15/19   Leatha Gilding, MD  levETIRAcetam (KEPPRA) 500 MG tablet Take 1 tablet (500 mg total) by mouth 2 (two) times daily. 12/18/19 02/16/20  Darci Current, MD    Allergies Patient has no known allergies.  Family History  Problem Relation Age of Onset   Seizures Neg Hx     Social History Social History   Tobacco Use   Smoking status: Never   Smokeless tobacco: Never  Substance Use Topics   Alcohol use: No   Drug use: No    Review of Systems  Constitutional: Negative for  fever. Eyes: Negative for visual changes. ENT: Negative for sore throat. Neck: No neck pain  Cardiovascular: Negative for chest pain. Respiratory: Negative for shortness of breath. Gastrointestinal: Negative for abdominal pain, vomiting or diarrhea. Genitourinary: Negative for dysuria. Musculoskeletal: Negative for back pain. Skin: Negative for rash. + insect bite Neurological: Negative for headaches, weakness or numbness. Psych: No SI or HI  ____________________________________________   PHYSICAL EXAM:  VITAL SIGNS: ED Triage Vitals  Enc Vitals Group     BP 01/20/21 0456 131/86     Pulse Rate 01/20/21 0456 76     Resp 01/20/21 0456 18     Temp 01/20/21 0456 98.5 F (36.9 C)     Temp Source 01/20/21 0456 Oral     SpO2 01/20/21 0456 97 %     Weight 01/20/21 0455 183 lb (83 kg)     Height 01/20/21 0455 5\' 3"  (1.6 m)     Head Circumference --      Peak Flow --      Pain Score 01/20/21 0454 6     Pain Loc --      Pain Edu? --      Excl. in GC? --     Constitutional: Alert and oriented. Well appearing and in no apparent distress. HEENT:      Head: Normocephalic and atraumatic.         Eyes: Conjunctivae are normal. Sclera is non-icteric.  Mouth/Throat: Mucous membranes are moist. No angioedema      Neck: Supple with no signs of meningismus. Cardiovascular: Regular rate and rhythm. No murmurs, gallops, or rubs. 2+ symmetrical distal pulses are present in all extremities. Respiratory: Normal respiratory effort. Lungs are clear to auscultation bilaterally.  Gastrointestinal: Soft, non tender. Musculoskeletal:  No edema, cyanosis, or erythema of extremities. There is a small insect bite on the R ring finger with no swelling, no crepitus, no erythema, no warmth.  Normal strength and sensation of the right upper extremity.  Full painless range of motion of all joints.  Normal pulses. Neurologic: Normal speech and language. Face is symmetric. Moving all extremities. No gross  focal neurologic deficits are appreciated. Skin: Skin is warm, dry and intact. No rash noted. Psychiatric: Mood and affect are normal. Speech and behavior are normal.  ____________________________________________   LABS (all labs ordered are listed, but only abnormal results are displayed)  Labs Reviewed - No data to display ____________________________________________  EKG  none  ____________________________________________  RADIOLOGY  none  ____________________________________________   PROCEDURES  Procedure(s) performed: None Procedures   Critical Care performed:  None ____________________________________________   INITIAL IMPRESSION / ASSESSMENT AND PLAN / ED COURSE  35 y.o. male who presents for evaluation of an insect bite.  Patient is well-appearing no distress, no signs of envenomation, no signs of anaphylaxis, no signs of cellulitis.  Will discharge home with supportive care.  Discussed my standard return precautions and follow-up with primary care doctor      _____________________________________________ Please note:  Patient was evaluated in Emergency Department today for the symptoms described in the history of present illness. Patient was evaluated in the context of the global COVID-19 pandemic, which necessitated consideration that the patient might be at risk for infection with the SARS-CoV-2 virus that causes COVID-19. Institutional protocols and algorithms that pertain to the evaluation of patients at risk for COVID-19 are in a state of rapid change based on information released by regulatory bodies including the CDC and federal and state organizations. These policies and algorithms were followed during the patient's care in the ED.  Some ED evaluations and interventions may be delayed as a result of limited staffing during the pandemic.   [place] Controlled Substance Database was reviewed by me. ____________________________________________   FINAL CLINICAL  IMPRESSION(S) / ED DIAGNOSES   Final diagnoses:  Insect bite of right ring finger, initial encounter      NEW MEDICATIONS STARTED DURING THIS VISIT:  ED Discharge Orders     None        Note:  This document was prepared using Dragon voice recognition software and may include unintentional dictation errors.    Nita Sickle, MD 01/20/21 [phone]

## 2021-08-16 ENCOUNTER — Emergency Department: Payer: Self-pay

## 2021-08-16 DIAGNOSIS — K30 Functional dyspepsia: Secondary | ICD-10-CM | POA: Insufficient documentation

## 2021-08-16 DIAGNOSIS — R0602 Shortness of breath: Secondary | ICD-10-CM | POA: Insufficient documentation

## 2021-08-16 DIAGNOSIS — R079 Chest pain, unspecified: Secondary | ICD-10-CM | POA: Insufficient documentation

## 2021-08-16 LAB — CBC WITH DIFFERENTIAL/PLATELET
Abs Immature Granulocytes: 0.02 10*3/uL (ref 0.00–0.07)
Basophils Absolute: 0 10*3/uL (ref 0.0–0.1)
Basophils Relative: 1 %
Eosinophils Absolute: 0.1 10*3/uL (ref 0.0–0.5)
Eosinophils Relative: 1 %
HCT: 46.4 % (ref 39.0–52.0)
Hemoglobin: 15.6 g/dL (ref 13.0–17.0)
Immature Granulocytes: 0 %
Lymphocytes Relative: 32 %
Lymphs Abs: 2.1 10*3/uL (ref 0.7–4.0)
MCH: 30.1 pg (ref 26.0–34.0)
MCHC: 33.6 g/dL (ref 30.0–36.0)
MCV: 89.6 fL (ref 80.0–100.0)
Monocytes Absolute: 0.8 10*3/uL (ref 0.1–1.0)
Monocytes Relative: 12 %
Neutro Abs: 3.6 10*3/uL (ref 1.7–7.7)
Neutrophils Relative %: 54 %
Platelets: 225 10*3/uL (ref 150–400)
RBC: 5.18 MIL/uL (ref 4.22–5.81)
RDW: 12.9 % (ref 11.5–15.5)
WBC: 6.6 10*3/uL (ref 4.0–10.5)
nRBC: 0 % (ref 0.0–0.2)

## 2021-08-16 NOTE — ED Triage Notes (Signed)
Pt reports that tonight around 7pm he had severe pain in the upper left chest that radiates to his epigastric region. Pt reports feeling short of breath at this time.

## 2021-08-17 ENCOUNTER — Emergency Department
Admission: EM | Admit: 2021-08-17 | Discharge: 2021-08-17 | Disposition: A | Discharge status: Home / Self Care | Payer: Self-pay | Attending: Emergency Medicine | Admitting: Emergency Medicine

## 2021-08-17 ENCOUNTER — Emergency Department: Payer: Self-pay

## 2021-08-17 DIAGNOSIS — R1013 Epigastric pain: Secondary | ICD-10-CM

## 2021-08-17 DIAGNOSIS — K30 Functional dyspepsia: Secondary | ICD-10-CM

## 2021-08-17 LAB — COMPREHENSIVE METABOLIC PANEL
ALT: 79 U/L — ABNORMAL HIGH (ref 0–44)
AST: 34 U/L (ref 15–41)
Albumin: 4.1 g/dL (ref 3.5–5.0)
Alkaline Phosphatase: 78 U/L (ref 38–126)
Anion gap: 8 (ref 5–15)
BUN: 22 mg/dL — ABNORMAL HIGH (ref 6–20)
CO2: 22 mmol/L (ref 22–32)
Calcium: 8.9 mg/dL (ref 8.9–10.3)
Chloride: 108 mmol/L (ref 98–111)
Creatinine, Ser: 1.19 mg/dL (ref 0.61–1.24)
GFR, Estimated: >60 mL/min (ref >60)
Glucose, Bld: 144 mg/dL — ABNORMAL HIGH (ref 70–99)
Potassium: 3.6 mmol/L (ref 3.5–5.1)
Sodium: 138 mmol/L (ref 135–145)
Total Bilirubin: 0.7 mg/dL (ref 0.3–1.2)
Total Protein: 7.3 g/dL (ref 6.5–8.1)

## 2021-08-17 LAB — TROPONIN I (HIGH SENSITIVITY)
Troponin I (High Sensitivity): 4 ng/L (ref <18)
Troponin I (High Sensitivity): 5 ng/L (ref <18)

## 2021-08-17 LAB — D-DIMER, QUANTITATIVE: D-Dimer, Quant: <0.27 ug/mL-FEU (ref 0.00–0.50)

## 2021-08-17 LAB — LIPASE, BLOOD: Lipase: 26 U/L (ref 11–51)

## 2021-08-17 MED ORDER — PANTOPRAZOLE SODIUM 40 MG PO TBEC: 40.0000 mg | DELAYED_RELEASE_TABLET | Freq: Every day | ORAL | 1 refills | Status: DC

## 2021-08-17 MED ORDER — ONDANSETRON HCL 4 MG/2ML IJ SOLN
4.0000 mg | Freq: Once | INTRAMUSCULAR | Status: AC
  Administered 2021-08-17: 4 mg via INTRAVENOUS
  Filled 2021-08-17: qty 2

## 2021-08-17 MED ORDER — FENTANYL CITRATE PF 50 MCG/ML IJ SOSY
50.0000 ug | PREFILLED_SYRINGE | Freq: Once | INTRAMUSCULAR | Status: AC
  Administered 2021-08-17: 50 ug via INTRAVENOUS
  Filled 2021-08-17: qty 1

## 2021-08-17 MED ORDER — FAMOTIDINE IN NACL 20-0.9 MG/50ML-% IV SOLN
20.0000 mg | Freq: Once | INTRAVENOUS | Status: AC
  Administered 2021-08-17: 20 mg via INTRAVENOUS
  Filled 2021-08-17: qty 50

## 2021-08-17 MED ORDER — IOHEXOL 300 MG/ML  SOLN
100.0000 mL | Freq: Once | INTRAMUSCULAR | Status: AC | PRN
  Administered 2021-08-17: 100 mL via INTRAVENOUS

## 2021-08-17 NOTE — ED Notes (Signed)
Pt to ED for CP that started 7pm yesterday, CP is central and radiates to L arm. Pt had episode of shortness of breath earlier, denies any current SOB. Pt also c/o lower abdominal pain that started the same time as CP.  Pt denies Cardiac hx.   Pt is A&OX4.

## 2021-08-17 NOTE — ED Provider Notes (Signed)
[facility] Provider Note    Event Date/Time   First MD Initiated Contact with Patient 08/17/21 0157     (approximate)   History   Chest Pain and Shortness of Breath   HPI  Daniel Contreras is a 36 y.o. male presents for evaluation of chest pain.  Patient reports that the pain started around 7 PM initially on his left shoulder and then moved down to his chest and epigastric region.  The pain is dull and makes it hard for him to breathe.  Has had nausea but no vomiting, no fever or chills, no dysuria or hematuria.  He denies any history of smoking or cardiac history.  No history of PE or DVT, no recent travel immobilization, no leg pain or swelling, no hemoptysis or exogenous hormones.     Past Medical History:  Diagnosis Date   Seizures (HCC)     No past surgical history on file.   Physical Exam   Triage Vital Signs: ED Triage Vitals  Enc Vitals Group     BP 08/16/21 2303 (!) 135/92     Pulse Rate 08/16/21 2303 83     Resp 08/16/21 2303 (!) 22     Temp 08/16/21 2303 98.3 F (36.8 C)     Temp Source 08/16/21 2303 Oral     SpO2 08/16/21 2303 98 %     Weight --      Height --      Head Circumference --      Peak Flow --      Pain Score 08/16/21 2301 8     Pain Loc --      Pain Edu? --      Excl. in GC? --     Most recent vital signs: Vitals:   08/17/21 0530 08/17/21 0630  BP: (!) 116/91   Pulse: (!) 59 (!) 53  Resp: 14 18  Temp:    SpO2: 99% 100%     Constitutional: Alert and oriented. Well appearing and in no apparent distress. HEENT:      Head: Normocephalic and atraumatic.         Eyes: Conjunctivae are normal. Sclera is non-icteric.       Mouth/Throat: Mucous membranes are moist.       Neck: Supple with no signs of meningismus. Cardiovascular: Regular rate and rhythm. No murmurs, gallops, or rubs. 2+ symmetrical distal pulses are present in all extremities.  Respiratory: Normal respiratory effort. Lungs are clear to  auscultation bilaterally.  Gastrointestinal: Soft, tender to palpation in the epigastric region, and non distended with positive bowel sounds. No rebound or guarding. Genitourinary: No CVA tenderness. Musculoskeletal:  No edema, cyanosis, or erythema of extremities. Neurologic: Normal speech and language. Face is symmetric. Moving all extremities. No gross focal neurologic deficits are appreciated. Skin: Skin is warm, dry and intact. No rash noted. Psychiatric: Mood and affect are normal. Speech and behavior are normal.  ED Results / Procedures / Treatments   Labs (all labs ordered are listed, but only abnormal results are displayed) Labs Reviewed  COMPREHENSIVE METABOLIC PANEL - Abnormal; Notable for the following components:      Result Value   Glucose, Bld 144 (*)    BUN 22 (*)    ALT 79 (*)    All other components within normal limits  CBC WITH DIFFERENTIAL/PLATELET  LIPASE, BLOOD  D-DIMER, QUANTITATIVE  TROPONIN I (HIGH SENSITIVITY)  TROPONIN I (HIGH SENSITIVITY)     EKG  ED  ECG REPORT I, Nita Sicklearolina Tru Leopard, the attending physician, personally viewed and interpreted this ECG.  Sinus rhythm with a rate of 90, normal intervals, normal axis, no ST elevations or depressions.  Normal EKG.  RADIOLOGY I, Nita Sicklearolina Mirca Yale, attending MD, have personally viewed and interpreted the images obtained during this visit as below:  CT negative  Chest x-ray negative   ___________________________________________________ Interpretation by Radiologist:  CT ABDOMEN PELVIS W CONTRAST  Result Date: 08/17/2021 CLINICAL DATA:  36 year old male with history of epigastric pain and severe pain in the left chest. EXAM: CT ABDOMEN AND PELVIS WITH CONTRAST TECHNIQUE: Multidetector CT imaging of the abdomen and pelvis was performed using the standard protocol following bolus administration of intravenous contrast. RADIATION DOSE REDUCTION: This exam was performed according to the departmental  dose-optimization program which includes automated exposure control, adjustment of the mA and/or kV according to patient size and/or use of iterative reconstruction technique. CONTRAST:  100mL OMNIPAQUE IOHEXOL 300 MG/ML  SOLN COMPARISON:  CT the pelvis 11/16/2019. CT the abdomen and pelvis 12/21/2017. FINDINGS: Lower chest: Unremarkable. Hepatobiliary: Diffuse low attenuation throughout the hepatic parenchyma, indicative of a background of severe hepatic steatosis. No suspicious cystic or solid hepatic lesions. No intra or extrahepatic biliary ductal dilatation. Gallbladder is normal in appearance. Pancreas: No pancreatic mass. No pancreatic ductal dilatation. No pancreatic or peripancreatic fluid collections or inflammatory changes. Spleen: Unremarkable. Adrenals/Urinary Tract: Bilateral kidneys and adrenal glands are normal in appearance. No hydroureteronephrosis. Urinary bladder is normal in appearance. Stomach/Bowel: The appearance of the stomach is normal. There is no pathologic dilatation of small bowel or colon. Normal appendix. Vascular/Lymphatic: No significant atherosclerotic disease, aneurysm or dissection noted in the abdominal or pelvic vasculature. No lymphadenopathy noted in the abdomen or pelvis. Reproductive: Prostate gland and seminal vesicles are unremarkable in appearance. Other: No significant volume of ascites.  No pneumoperitoneum. Musculoskeletal: Dystrophic calcifications noted in the proximal right quadriceps musculature, incompletely imaged, presumably related to remote trauma (similar to the prior study). There are no aggressive appearing lytic or blastic lesions noted in the visualized portions of the skeleton. IMPRESSION: 1. No acute findings are noted in the abdomen or pelvis to account for the patient's symptoms. 2. However, there is severe hepatic steatosis. Electronically Signed   By: Trudie Reedaniel  Entrikin M.D.   On: 08/17/2021 06:14   DG Chest Portable 1 View  Result Date:  08/16/2021 CLINICAL DATA:  Chest pain EXAM: PORTABLE CHEST 1 VIEW COMPARISON:  None Available. FINDINGS: Lungs volumes are small, but are symmetric and are clear. No pneumothorax or pleural effusion. Cardiac size within normal limits. Pulmonary vascularity is normal. Osseous structures are age-appropriate. No acute bone abnormality. IMPRESSION: No active disease. Electronically Signed   By: Helyn NumbersAshesh  Parikh M.D.   On: 08/16/2021 23:35        PROCEDURES:  Critical Care performed: No  Procedures    IMPRESSION / MDM / ASSESSMENT AND PLAN / ED COURSE  I reviewed the triage vital signs and the nursing notes.  36 y.o. male presents for evaluation of chest pain/epigastric pain since this evening.  On exam patient is well-appearing and in no distress, has normal vital signs with normal work of breathing, normal sats, lungs are clear to auscultation, heart regular rate and rhythm, abdomen is soft with epigastric tenderness but no rebound or guarding  Ddx: Indigestion/GERD versus pancreatitis versus gallbladder pathology versus peptic ulcer disease versus gastritis versus ACS although atypical.  PE was considered since initially in triage patient was found to have slightly elevated  respiratory rate    Plan: EKG, troponin x2, D-dimer, CBC, BMP, LFTs, lipase, chest x-ray.  Will give IV fentanyl, Pepcid and Zofran.  Patient placed on telemetry for monitoring of cardiorespiratory status.   MEDICATIONS GIVEN IN ED: Medications  fentaNYL (SUBLIMAZE) injection 50 mcg (50 mcg Intravenous Given 08/17/21 0217)  ondansetron (ZOFRAN) injection 4 mg (4 mg Intravenous Given 08/17/21 0215)  famotidine (PEPCID) IVPB 20 mg premix (0 mg Intravenous Stopped 08/17/21 0431)  iohexol (OMNIPAQUE) 300 MG/ML solution 100 mL (100 mLs Intravenous Contrast Given 08/17/21 0548)     ED COURSE: EKG and 2 high-sensitivity troponins showing no signs of ischemia.  D-dimer is negative.  Lipase is normal, mildly elevated LFTs which  seems to be chronic, no electrolyte derangements, no leukocytosis.  Chest x-ray showing no acute findings.  CT abdomen pelvis showing no acute pathology.  After IV Pepcid, fentanyl and Zofran patient reports that his symptoms have resolved.  Vitals remained stable.  Palpation of the abdomen shows no tenderness at this time.  Most likely digestion/GERD.  We discussed dietary changes, follow-up with PCP and my standard return precautions.  No indication for admission with negative work-up   Consults: none   EMR reviewed none    FINAL CLINICAL IMPRESSION(S) / ED DIAGNOSES   Final diagnoses:  Epigastric abdominal pain  Indigestion     Rx / DC Orders   ED Discharge Orders          Ordered    pantoprazole (PROTONIX) 40 MG tablet  Daily        08/17/21 0631             Note:  This document was prepared using Dragon voice recognition software and may include unintentional dictation errors.   Please note:  Patient was evaluated in Emergency Department today for the symptoms described in the history of present illness. Patient was evaluated in the context of the global COVID-19 pandemic, which necessitated consideration that the patient might be at risk for infection with the SARS-CoV-2 virus that causes COVID-19. Institutional protocols and algorithms that pertain to the evaluation of patients at risk for COVID-19 are in a state of rapid change based on information released by regulatory bodies including the CDC and federal and state organizations. These policies and algorithms were followed during the patient's care in the ED.  Some ED evaluations and interventions may be delayed as a result of limited staffing during the pandemic.       Don Perking, [place], MD 08/17/21 [phone]

## 2021-08-17 NOTE — ED Notes (Signed)
Patient transported to CT 

## 2021-08-17 NOTE — ED Notes (Signed)
Discharge instructions including prescription discussed with pt. Pt verbalized understanding with no questions at this time. Pt to go home with s/o at bedside.  

## 2022-02-01 ENCOUNTER — Emergency Department: Payer: Self-pay

## 2022-02-01 ENCOUNTER — Other Ambulatory Visit: Payer: Self-pay

## 2022-02-01 DIAGNOSIS — R0789 Other chest pain: Secondary | ICD-10-CM | POA: Insufficient documentation

## 2022-02-01 DIAGNOSIS — W228XXA Striking against or struck by other objects, initial encounter: Secondary | ICD-10-CM | POA: Insufficient documentation

## 2022-02-01 NOTE — ED Triage Notes (Signed)
Spanish interpreter used for triage.  Pt presents with right rib injury while pulling the door to keep the dog out.  Pt states he was using all of his strength and felt a "pop"   This happened today.

## 2022-02-02 ENCOUNTER — Emergency Department
Admission: EM | Admit: 2022-02-02 | Discharge: 2022-02-02 | Disposition: A | Discharge status: Home / Self Care | Payer: Self-pay | Attending: Emergency Medicine | Admitting: Emergency Medicine

## 2022-02-02 DIAGNOSIS — R0789 Other chest pain: Secondary | ICD-10-CM

## 2022-02-02 MED ORDER — HYDROCODONE-ACETAMINOPHEN 5-325 MG PO TABS
ORAL_TABLET | ORAL | Status: AC
  Filled 2022-02-02: qty 1

## 2022-02-02 NOTE — ED Provider Notes (Signed)
[facility] Provider Note   Event Date/Time   First MD Initiated Contact with Patient 02/02/22 0140     (approximate) History  Rib Injury  HPI Daniel Contreras is a 36 y.o. male with no stated past medical history presents for right anterior chest wall pain that occurred when he was trying to shut a door tonight and felt a "pop" at the medial aspect of the bottom border of his right ribs.  Patient states that he has had pain with inspiration as well as with palpation since that time.  Patient describes a 6/10, nonradiating aching pain ROS: Patient currently denies any vision changes, tinnitus, difficulty speaking, facial droop, sore throat, shortness of breath, abdominal pain, nausea/vomiting/diarrhea, dysuria, or weakness/numbness/paresthesias in any extremity   Physical Exam  Triage Vital Signs: ED Triage Vitals  Enc Vitals Group     BP 02/01/22 2312 124/80     Pulse Rate 02/01/22 2312 93     Resp 02/01/22 2312 18     Temp 02/01/22 2312 98.5 F (36.9 C)     Temp Source 02/01/22 2312 Oral     SpO2 02/01/22 2312 97 %     Weight --      Height --      Head Circumference --      Peak Flow --      Pain Score 02/02/22 0143 6     Pain Loc --      Pain Edu? --      Excl. in [place]? --    Most recent vital signs: Vitals:   02/01/22 2312  BP: 124/80  Pulse: 93  Resp: 18  Temp: 98.5 F (36.9 C)  SpO2: 97%   General: Awake, oriented x4. CV:  Good peripheral perfusion.  Resp:  Normal effort.  Abd:  No distention.  Other:  Middle-aged overweight Hispanic male laying in bed in mild distress secondary to pain.  Point tenderness to palpation over the right medial lower costal margin ED Results / Procedures / Treatments  RADIOLOGY ED MD interpretation: 2 view chest x-ray interpreted by me shows no evidence of acute abnormalities including no pneumonia, pneumothorax, or widened mediastinum -Agree with radiology assessment Official radiology report(s): DG  Chest 2 View  Result Date: 02/01/2022 CLINICAL DATA:  Status post right rib trauma. EXAM: CHEST - 2 VIEW COMPARISON:  Aug 16, 2021 FINDINGS: The heart size and mediastinal contours are within normal limits. Both lungs are clear. The visualized skeletal structures are unremarkable. IMPRESSION: No active cardiopulmonary disease. Electronically Signed   By: Virgina Norfolk M.D.   On: 02/01/2022 23:51   PROCEDURES: Critical Care performed: No Procedures MEDICATIONS ORDERED IN ED: Medications  HYDROcodone-acetaminophen (NORCO/VICODIN) 5-325 MG per tablet (has no administration in time range)   IMPRESSION / MDM / ASSESSMENT AND PLAN / ED COURSE  I reviewed the triage vital signs and the nursing notes.                             Patient's presentation is most consistent with acute presentation with potential threat to life or bodily function. This patient presents with atypical chest pain, most likely secondary to musculoskeletal injury. Differential diagnosis includes rib fracture, costochondritis, sternal fracture. Low suspicion for ACS, acute PE (PERC negative), pericarditis / myocarditis, thoracic aortic dissection, pneumothorax, pneumonia or other acute infectious process. Presentation not consistent with other acute, emergent causes of chest pain at this time. No indication for  cardiac enzyme testing. Plan to order CXR to evaluate for acute cardiopulmonary causes.  Plan: CXR, pain control  Dispo: Discharge home with home care   FINAL CLINICAL IMPRESSION(S) / ED DIAGNOSES   Final diagnoses:  Right-sided chest wall pain   Rx / DC Orders   ED Discharge Orders     None      Note:  This document was prepared using Dragon voice recognition software and may include unintentional dictation errors.   Merwyn Katos, MD 02/02/22 [phone]

## 2022-02-02 NOTE — ED Notes (Signed)
Administered 5-325 MG Norco per order

## 2022-06-07 ENCOUNTER — Emergency Department: Payer: Self-pay

## 2022-06-07 ENCOUNTER — Emergency Department
Admission: EM | Admit: 2022-06-07 | Discharge: 2022-06-07 | Disposition: A | Discharge status: Home / Self Care | Payer: Self-pay | Attending: Emergency Medicine | Admitting: Emergency Medicine

## 2022-06-07 ENCOUNTER — Other Ambulatory Visit: Payer: Self-pay

## 2022-06-07 DIAGNOSIS — K625 Hemorrhage of anus and rectum: Secondary | ICD-10-CM

## 2022-06-07 DIAGNOSIS — R112 Nausea with vomiting, unspecified: Secondary | ICD-10-CM | POA: Insufficient documentation

## 2022-06-07 DIAGNOSIS — E871 Hypo-osmolality and hyponatremia: Secondary | ICD-10-CM | POA: Insufficient documentation

## 2022-06-07 DIAGNOSIS — R197 Diarrhea, unspecified: Secondary | ICD-10-CM | POA: Insufficient documentation

## 2022-06-07 DIAGNOSIS — K648 Other hemorrhoids: Secondary | ICD-10-CM | POA: Insufficient documentation

## 2022-06-07 LAB — TYPE AND SCREEN
ABO/RH(D): O POS
Antibody Screen: NEGATIVE

## 2022-06-07 LAB — COMPREHENSIVE METABOLIC PANEL
ALT: 65 U/L — ABNORMAL HIGH (ref 0–44)
AST: 40 U/L (ref 15–41)
Albumin: 4.4 g/dL (ref 3.5–5.0)
Alkaline Phosphatase: 75 U/L (ref 38–126)
Anion gap: 4 — ABNORMAL LOW (ref 5–15)
BUN: 23 mg/dL — ABNORMAL HIGH (ref 6–20)
CO2: 24 mmol/L (ref 22–32)
Calcium: 8.8 mg/dL — ABNORMAL LOW (ref 8.9–10.3)
Chloride: 104 mmol/L (ref 98–111)
Creatinine, Ser: 0.95 mg/dL (ref 0.61–1.24)
GFR, Estimated: >60 mL/min (ref >60)
Glucose, Bld: 108 mg/dL — ABNORMAL HIGH (ref 70–99)
Potassium: 3.6 mmol/L (ref 3.5–5.1)
Sodium: 132 mmol/L — ABNORMAL LOW (ref 135–145)
Total Bilirubin: 0.8 mg/dL (ref 0.3–1.2)
Total Protein: 7.9 g/dL (ref 6.5–8.1)

## 2022-06-07 LAB — CBC
HCT: 45.9 % (ref 39.0–52.0)
Hemoglobin: 15.4 g/dL (ref 13.0–17.0)
MCH: 30.7 pg (ref 26.0–34.0)
MCHC: 33.6 g/dL (ref 30.0–36.0)
MCV: 91.4 fL (ref 80.0–100.0)
Platelets: 183 10*3/uL (ref 150–400)
RBC: 5.02 MIL/uL (ref 4.22–5.81)
RDW: 13.4 % (ref 11.5–15.5)
WBC: 6.4 10*3/uL (ref 4.0–10.5)
nRBC: 0 % (ref 0.0–0.2)

## 2022-06-07 LAB — LIPASE, BLOOD: Lipase: 33 U/L (ref 11–51)

## 2022-06-07 MED ORDER — SODIUM CHLORIDE 0.9 % IV BOLUS
1000.0000 mL | Freq: Once | INTRAVENOUS | Status: AC
Start: 2022-06-07 — End: 2022-06-07
  Administered 2022-06-07: 1000 mL via INTRAVENOUS

## 2022-06-07 MED ORDER — IOHEXOL 300 MG/ML  SOLN
100.0000 mL | Freq: Once | INTRAMUSCULAR | Status: AC | PRN
  Administered 2022-06-07: 100 mL via INTRAVENOUS

## 2022-06-07 MED ORDER — ONDANSETRON 4 MG PO TBDP: 4.0000 mg | ORAL_TABLET | Freq: Three times a day (TID) | ORAL | 0 refills | Status: DC | PRN

## 2022-06-07 MED ORDER — ONDANSETRON HCL 4 MG/2ML IJ SOLN
4.0000 mg | Freq: Once | INTRAMUSCULAR | Status: AC
Start: 2022-06-07 — End: 2022-06-07
  Administered 2022-06-07: 4 mg via INTRAVENOUS

## 2022-06-07 MED ORDER — KETOROLAC TROMETHAMINE 15 MG/ML IJ SOLN
15.0000 mg | Freq: Once | INTRAMUSCULAR | Status: AC
  Administered 2022-06-07: 15 mg via INTRAVENOUS
  Filled 2022-06-07: qty 1

## 2022-06-07 NOTE — ED Triage Notes (Signed)
Pt to ED from home for hemorrhoids for "awhile". Pt is now having bloody diarrhea. Pt feels a lot of pressure in his rectum. Pt has also been having episodes of vomiting as well. Pt is CAOx4 and ambulatory in triage.

## 2022-06-07 NOTE — ED Provider Notes (Signed)
[facility] Provider Note    Event Date/Time   First MD Initiated Contact with Patient 06/07/22 1713     (approximate)   History   Hemorrhoids and Rectal Bleeding   HPI  Daniel Contreras is a 37 y.o. male presents to the emergency department with abdominal pain and rectal bleeding.  States that he has had multiple episodes of bloody diarrhea and vomiting since yesterday.  10-12 episodes yesterday and approximately 8 episodes of watery diarrhea today.  Multiple episodes of nausea and vomiting.  Complaining of abdominal pain that is worse in his left lower abdomen.  Prior history of small bowel obstruction which she states needed surgery.  No prior history of Crohn's disease or ulcerative colitis.  No similar episodes in the past of today.  No known sick contacts.  No recent trip or travel.  Denies any daily NSAID use.  Denies any alcohol use.     Physical Exam   Triage Vital Signs: ED Triage Vitals  Enc Vitals Group     BP 06/07/22 1702 132/83     Pulse Rate 06/07/22 1700 80     Resp 06/07/22 1700 16     Temp 06/07/22 1700 98 F (36.7 C)     Temp Source 06/07/22 1700 Oral     SpO2 06/07/22 1700 98 %     Weight 06/07/22 1700 180 lb (81.6 kg)     Height 06/07/22 1700 '5\' 3"'$  (1.6 m)     Head Circumference --      Peak Flow --      Pain Score 06/07/22 1700 9     Pain Loc --      Pain Edu? --      Excl. in [place]? --     Most recent vital signs: Vitals:   06/07/22 1702 06/07/22 1715  BP: 132/83 126/87  Pulse:    Resp:  (!) 30  Temp:    SpO2:      Physical Exam Constitutional:      Appearance: He is well-developed.  HENT:     Head: Atraumatic.  Eyes:     Conjunctiva/sclera: Conjunctivae normal.  Cardiovascular:     Rate and Rhythm: Regular rhythm.  Pulmonary:     Effort: No respiratory distress.  Abdominal:     General: Distension: LLQ.     Tenderness: There is abdominal tenderness.  Genitourinary:    Comments: DRE with palpable  internal hemorrhoids.  No external hemorrhoids.  No gross blood or melena.  No stool in the rectal vault. Musculoskeletal:     Cervical back: Normal range of motion.  Skin:    General: Skin is warm.     Capillary Refill: Capillary refill takes less than 2 seconds.  Neurological:     Mental Status: He is alert. Mental status is at baseline.     IMPRESSION / MDM / ASSESSMENT AND PLAN / ED COURSE  I reviewed the triage vital signs and the nursing notes.  Differential diagnosis including viral gastroenteritis, internal hemorrhoids, C. difficile, viral illness including COVID/influenza, electrolyte abnormality, dehydration, anemia  EKG  I, Nathaniel Man, the attending physician, personally viewed and interpreted this ECG.   Rate: Normal  Rhythm: Normal sinus  Axis: Normal  Intervals: Normal  ST&T Change: None  No tachycardic or bradycardic dysrhythmias while on cardiac telemetry.  RADIOLOGY I independently reviewed imaging, my interpretation of imaging: CT abdomen pelvis with contrast  LABS (all labs ordered are listed, but only abnormal results are  displayed) Labs interpreted as -    Labs Reviewed  COMPREHENSIVE METABOLIC PANEL - Abnormal; Notable for the following components:      Result Value   Sodium 132 (*)    Glucose, Bld 108 (*)    BUN 23 (*)    Calcium 8.8 (*)    ALT 65 (*)    Anion gap 4 (*)    All other components within normal limits  GASTROINTESTINAL PANEL BY PCR, STOOL (REPLACES STOOL CULTURE)  C DIFFICILE QUICK SCREEN W PCR REFLEX    CBC  LIPASE, BLOOD  POC OCCULT BLOOD, ED  TYPE AND SCREEN  TYPE AND SCREEN  TYPE AND SCREEN    TREATMENT  1 L of IV fluids, IV ketorolac, IV Zofran  MDM  Lab work overall reassuring.  Only mild hyponatremia likely secondary to dehydration.  No significant electrolyte abnormalities.  Hemoglobin is stable.  No further episodes or signs of GI bleed in the emergency department.  CT scan without acute findings.  No signs  of acute diverticulitis or acute appendicitis.  Unable to provide a stool sample and have a low suspicion for C. difficile.  Tolerated p.o. in the emergency department.  Most likely with viral gastroenteritis.  Patient started on antiemetics.  Given return precautions and discussed oral hydration.  Given information to follow-up as an outpatient with primary care provider.     PROCEDURES:  Critical Care performed: No  Procedures  Patient's presentation is most consistent with acute presentation with potential threat to life or bodily function.   MEDICATIONS ORDERED IN ED: Medications  ondansetron (ZOFRAN) injection 4 mg (4 mg Intravenous Given 06/07/22 1716)  sodium chloride 0.9 % bolus 1,000 mL (1,000 mLs Intravenous New Bag/Given 06/07/22 1737)  ketorolac (TORADOL) 15 MG/ML injection 15 mg (15 mg Intravenous Given 06/07/22 1737)  iohexol (OMNIPAQUE) 300 MG/ML solution 100 mL (100 mLs Intravenous Contrast Given 06/07/22 1751)    FINAL CLINICAL IMPRESSION(S) / ED DIAGNOSES   Final diagnoses:  Rectal bleeding  Nausea vomiting and diarrhea     Rx / DC Orders   ED Discharge Orders          Ordered    ondansetron (ZOFRAN-ODT) 4 MG disintegrating tablet  Every 8 hours PRN        06/07/22 1926             Note:  This document was prepared using Dragon voice recognition software and may include unintentional dictation errors.   Nathaniel Man, MD 06/07/22 [phone]

## 2022-06-07 NOTE — Discharge Instructions (Addendum)
Control de dolor: Ibuprofeno (motrin/aleve/advil): puede tomar de 3 a 4 tabletas (600 a 800 mg) cada 6 horas segn sea necesario para el dolor o la fiebre. Acetaminofn (tylenol): puede tomar 2 tabletas extrafuertes (1000 mg) cada 6 horas segn sea necesario para el dolor o la fiebre. Puede alternar estos medicamentos o tomarlos juntos. Asegrese de comer/beber agua mientras toma estos medicamentos. 

## 2022-06-07 NOTE — ED Notes (Signed)
Pt had witnessed syncope in triage. Pt got really pale and clammy and passed out for a brief few seconds. Placed on ED stretcher and mvoed to room 7.

## 2022-07-29 ENCOUNTER — Emergency Department: Payer: Self-pay

## 2022-07-29 ENCOUNTER — Other Ambulatory Visit: Payer: Self-pay

## 2022-07-29 ENCOUNTER — Emergency Department
Admission: EM | Admit: 2022-07-29 | Discharge: 2022-07-29 | Disposition: A | Discharge status: Home / Self Care | Payer: Self-pay | Attending: Emergency Medicine | Admitting: Emergency Medicine

## 2022-07-29 DIAGNOSIS — R369 Urethral discharge, unspecified: Secondary | ICD-10-CM

## 2022-07-29 DIAGNOSIS — R1030 Lower abdominal pain, unspecified: Secondary | ICD-10-CM

## 2022-07-29 DIAGNOSIS — R112 Nausea with vomiting, unspecified: Secondary | ICD-10-CM | POA: Insufficient documentation

## 2022-07-29 DIAGNOSIS — R300 Dysuria: Secondary | ICD-10-CM

## 2022-07-29 LAB — COMPREHENSIVE METABOLIC PANEL
ALT: 53 U/L — ABNORMAL HIGH (ref 0–44)
AST: 34 U/L (ref 15–41)
Albumin: 4.3 g/dL (ref 3.5–5.0)
Alkaline Phosphatase: 77 U/L (ref 38–126)
Anion gap: 8 (ref 5–15)
BUN: 26 mg/dL — ABNORMAL HIGH (ref 6–20)
CO2: 24 mmol/L (ref 22–32)
Calcium: 9 mg/dL (ref 8.9–10.3)
Chloride: 104 mmol/L (ref 98–111)
Creatinine, Ser: 1.04 mg/dL (ref 0.61–1.24)
GFR, Estimated: >60 mL/min (ref >60)
Glucose, Bld: 141 mg/dL — ABNORMAL HIGH (ref 70–99)
Potassium: 3.6 mmol/L (ref 3.5–5.1)
Sodium: 136 mmol/L (ref 135–145)
Total Bilirubin: 0.8 mg/dL (ref 0.3–1.2)
Total Protein: 7.3 g/dL (ref 6.5–8.1)

## 2022-07-29 LAB — LIPASE, BLOOD: Lipase: 31 U/L (ref 11–51)

## 2022-07-29 LAB — CBC
HCT: 44.3 % (ref 39.0–52.0)
Hemoglobin: 15.2 g/dL (ref 13.0–17.0)
MCH: 30.8 pg (ref 26.0–34.0)
MCHC: 34.3 g/dL (ref 30.0–36.0)
MCV: 89.9 fL (ref 80.0–100.0)
Platelets: 224 10*3/uL (ref 150–400)
RBC: 4.93 MIL/uL (ref 4.22–5.81)
RDW: 12.8 % (ref 11.5–15.5)
WBC: 8.8 10*3/uL (ref 4.0–10.5)
nRBC: 0 % (ref 0.0–0.2)

## 2022-07-29 LAB — URINALYSIS, ROUTINE W REFLEX MICROSCOPIC
Bilirubin Urine: NEGATIVE
Glucose, UA: NEGATIVE mg/dL
Hgb urine dipstick: NEGATIVE
Ketones, ur: NEGATIVE mg/dL
Leukocytes,Ua: NEGATIVE
Nitrite: NEGATIVE
Protein, ur: NEGATIVE mg/dL
Specific Gravity, Urine: 1.027 (ref 1.005–1.030)
pH: 5 (ref 5.0–8.0)

## 2022-07-29 MED ORDER — DOXYCYCLINE HYCLATE 50 MG PO CAPS: 100.0000 mg | ORAL_CAPSULE | Freq: Two times a day (BID) | ORAL | 0 refills | Status: AC

## 2022-07-29 MED ORDER — ONDANSETRON 8 MG PO TBDP: 8.0000 mg | ORAL_TABLET | Freq: Three times a day (TID) | ORAL | 0 refills | Status: DC | PRN

## 2022-07-29 MED ORDER — CEFTRIAXONE SODIUM 1 G IJ SOLR
500.0000 mg | Freq: Once | INTRAMUSCULAR | Status: AC
  Administered 2022-07-29: 500 mg via INTRAMUSCULAR
  Filled 2022-07-29: qty 10

## 2022-07-29 MED ORDER — DOXYCYCLINE HYCLATE 100 MG PO TABS
100.0000 mg | ORAL_TABLET | Freq: Once | ORAL | Status: AC
  Administered 2022-07-29: 100 mg via ORAL
  Filled 2022-07-29: qty 1

## 2022-07-29 NOTE — ED Triage Notes (Signed)
Pt comes with c/o belly pain and pain in testicle. Pt states two weeks ago he was hit in scrotum with soccer ball. Pt also states pain when he urinates.   Pt states left testicle. Pt states trouble urinating. Pt states 8/10 pain

## 2022-07-29 NOTE — ED Provider Notes (Signed)
[facility] Provider Note   Event Date/Time   First MD Initiated Contact with Patient 07/29/22 2042     (approximate) History  Abdominal Pain  HPI Daniel Contreras is a 37 y.o. male with no stated past medical history who presents complaining of lower abdominal pain, dysuria, and abnormal penile discharge over the last week.  Patient denies any unprotected new sexual partners.  Patient states this pain is 8/10 in severity and has kept him from sleep.  Patient also endorses 1 episode of nausea/vomiting ROS: Patient currently denies any vision changes, tinnitus, difficulty speaking, facial droop, sore throat, chest pain, shortness of breath, diarrhea, or weakness/numbness/paresthesias in any extremity   Physical Exam  Triage Vital Signs: ED Triage Vitals  Enc Vitals Group     BP 07/29/22 1853 (!) 139/96     Pulse Rate 07/29/22 1853 88     Resp 07/29/22 1853 18     Temp 07/29/22 1853 98 F (36.7 C)     Temp src --      SpO2 07/29/22 1853 97 %     Weight --      Height --      Head Circumference --      Peak Flow --      Pain Score 07/29/22 1852 8     Pain Loc --      Pain Edu? --      Excl. in [place]? --    Most recent vital signs: Vitals:   07/29/22 1853 07/29/22 2232  BP: (!) 139/96 (!) 135/58  Pulse: 88   Resp: 18 18  Temp: 98 F (36.7 C)   SpO2: 97% 100%   General: Awake, oriented x4. CV:  Good peripheral perfusion.  Resp:  Normal effort.  Abd:  No distention.  Other:  Middle-aged overweight Hispanic male laying in bed in no acute distress.  Genital exam shows normal external circumcised male genitalia with yellow purulent discharge at the urethral meatus ED Results / Procedures / Treatments  Labs (all labs ordered are listed, but only abnormal results are displayed) Labs Reviewed  COMPREHENSIVE METABOLIC PANEL - Abnormal; Notable for the following components:      Result Value   Glucose, Bld 141 (*)    BUN 26 (*)    ALT 53 (*)    All  other components within normal limits  URINALYSIS, ROUTINE W REFLEX MICROSCOPIC - Abnormal; Notable for the following components:   Color, Urine YELLOW (*)    APPearance CLEAR (*)    All other components within normal limits  LIPASE, BLOOD  CBC   RADIOLOGY ED MD interpretation: Ultrasound of the scrotum interpreted independently by me and shows no evidence of acute abnormalities including no testicular torsion.  There incidentally found small varicoceles bilaterally -Agree with radiology assessment Official radiology report(s): US SCROTUM W/DOPPLER  Result Date: 07/29/2022 CLINICAL DATA:  Scrotal pain EXAM: SCROTAL ULTRASOUND DOPPLER ULTRASOUND OF THE TESTICLES TECHNIQUE: Complete ultrasound examination of the testicles, epididymis, and other scrotal structures was performed. Color and spectral Doppler ultrasound were also utilized to evaluate blood flow to the testicles. COMPARISON:  None Available. FINDINGS: Right testicle Measurements: 4.6 x 2.1 x 3.0 cm. No mass or microlithiasis visualized. Left testicle Measurements: 3.6 x 1.7 x 3.1 cm. No mass or microlithiasis visualized. Right epididymis:  Normal in size and appearance. Left epididymis:  Normal in size and appearance. Hydrocele:  None visualized. Varicocele:  Bilateral small varicoceles are noted. Pulsed Doppler interrogation of both  testes demonstrates normal low resistance arterial and venous waveforms bilaterally. IMPRESSION: No evidence of testicular torsion. Small varicoceles bilaterally. Electronically Signed   By: Alcide Clever M.D.   On: 07/29/2022 19:52   PROCEDURES: Critical Care performed: No Procedures MEDICATIONS ORDERED IN ED: Medications  cefTRIAXone (ROCEPHIN) injection 500 mg (500 mg Intramuscular Given 07/29/22 2230)  doxycycline (VIBRA-TABS) tablet 100 mg (100 mg Oral Given 07/29/22 2229)   IMPRESSION / MDM / ASSESSMENT AND PLAN / ED COURSE  I reviewed the triage vital signs and the nursing notes.                              Patient's presentation is most consistent with acute presentation with potential threat to life or bodily function. No e/o epididymo-orchitis on exam and low suspicion for rectal abscess, prostatitis, other GU deep space infection, gonorrhea/chlamydia. Unlikely Infected Urolithiasis, AAA, cholecystitis, pancreatitis, SBO, appendicitis, or other acute abdomen. Workup: UA: None Rx: 500 mg Rocephin IM, doxycycline 100 mg twice daily x 7 days  Disposition: Discharge home. SRP discussed. Advise follow up with primary care provider within 24-72 hours.   FINAL CLINICAL IMPRESSION(S) / ED DIAGNOSES   Final diagnoses:  Lower abdominal pain  Dysuria  Penile discharge   Rx / DC Orders   ED Discharge Orders          Ordered    Ambulatory Referral to Primary Care (Establish Care)        07/29/22 2201    doxycycline (VIBRAMYCIN) 50 MG capsule  2 times daily        07/29/22 2201    ondansetron (ZOFRAN-ODT) 8 MG disintegrating tablet  Every 8 hours PRN        07/29/22 2201           Note:  This document was prepared using Dragon voice recognition software and may include unintentional dictation errors.   Merwyn Katos, MD 07/29/22 [phone]

## 2022-12-16 ENCOUNTER — Emergency Department
Admission: EM | Admit: 2022-12-16 | Discharge: 2022-12-17 | Disposition: A | Discharge status: Home / Self Care | Payer: Self-pay | Attending: Emergency Medicine | Admitting: Emergency Medicine

## 2022-12-16 ENCOUNTER — Other Ambulatory Visit: Payer: Self-pay

## 2022-12-16 ENCOUNTER — Emergency Department: Payer: Self-pay

## 2022-12-16 DIAGNOSIS — R079 Chest pain, unspecified: Secondary | ICD-10-CM

## 2022-12-16 DIAGNOSIS — K529 Noninfective gastroenteritis and colitis, unspecified: Secondary | ICD-10-CM | POA: Insufficient documentation

## 2022-12-16 DIAGNOSIS — R0789 Other chest pain: Secondary | ICD-10-CM | POA: Insufficient documentation

## 2022-12-16 LAB — C DIFFICILE QUICK SCREEN W PCR REFLEX
C Diff antigen: NEGATIVE
C Diff interpretation: NOT DETECTED
C Diff toxin: NEGATIVE

## 2022-12-16 LAB — BASIC METABOLIC PANEL
Anion gap: 11 (ref 5–15)
BUN: 14 mg/dL (ref 6–20)
CO2: 25 mmol/L (ref 22–32)
Calcium: 9 mg/dL (ref 8.9–10.3)
Chloride: 103 mmol/L (ref 98–111)
Creatinine, Ser: 0.92 mg/dL (ref 0.61–1.24)
GFR, Estimated: >60 mL/min (ref >60)
Glucose, Bld: 103 mg/dL — ABNORMAL HIGH (ref 70–99)
Potassium: 3.3 mmol/L — ABNORMAL LOW (ref 3.5–5.1)
Sodium: 139 mmol/L (ref 135–145)

## 2022-12-16 LAB — GASTROINTESTINAL PANEL BY PCR, STOOL (REPLACES STOOL CULTURE)

## 2022-12-16 LAB — CBC
HCT: 48.1 % (ref 39.0–52.0)
Hemoglobin: 16.1 g/dL (ref 13.0–17.0)
MCH: 30.7 pg (ref 26.0–34.0)
MCHC: 33.5 g/dL (ref 30.0–36.0)
MCV: 91.6 fL (ref 80.0–100.0)
Platelets: 251 10*3/uL (ref 150–400)
RBC: 5.25 MIL/uL (ref 4.22–5.81)
RDW: 13.2 % (ref 11.5–15.5)
WBC: 7.6 10*3/uL (ref 4.0–10.5)
nRBC: 0 % (ref 0.0–0.2)

## 2022-12-16 LAB — TROPONIN I (HIGH SENSITIVITY): Troponin I (High Sensitivity): 3 ng/L (ref <18)

## 2022-12-16 MED ORDER — DICYCLOMINE HCL 10 MG PO CAPS: 10.0000 mg | ORAL_CAPSULE | Freq: Three times a day (TID) | ORAL | 0 refills | Status: DC

## 2022-12-16 MED ORDER — DICYCLOMINE HCL 10 MG PO CAPS
10.0000 mg | ORAL_CAPSULE | Freq: Once | ORAL | Status: AC
  Administered 2022-12-17: 10 mg via ORAL
  Filled 2022-12-16: qty 1

## 2022-12-16 NOTE — Discharge Instructions (Addendum)
Your exam, labs, EKG, and chest x-ray all normal and reassuring at this time.  No signs of a cardiac source for your chest pain.  No evidence of an gastrointestinal infection causing your colitis.  You should take the prescription med for abdominal cramping as prescribed.  Follow-up with Dr. Mia Contreras at [facility] gastrointestinal medicine for further management of your colitis symptoms.  Return to the ED needed.  Seu exame, laboratrios, eletrocardiograma e radiografia de trax so normais e tranquilizadores neste momento.  No h sinais de uma fonte cardaca para a sua dor no peito.  Nenhuma evidncia de uma infeo gastrointestinal que cause a sua colite.  Voc deve tomar o medicamento de prescrio para clicas abdominais como prescrito.  Acompanhamento com o Dr. Mia Contreras na medic[facility] gastrointestinal [facility] para uma maior gesto dos seus sintomas de colite.  Voltar ao ED necessrio.

## 2022-12-16 NOTE — ED Provider Notes (Signed)
[facility] Emergency Department Provider Note     Event Date/Time   First MD Initiated Contact with Patient 12/16/22 2018     (approximate)   History   Chest Pain   HPI  Limited by Spanish language.  Tele-interpreter [phone]) used for interview and exam.  Daniel Contreras is a 37 y.o. male with a history of seizures (formally on Keppra), presents to the ED for evaluation of 2 days of intermittent chest pain.  Patient would describe left-sided chest pain with referral into the left arm.  He describes symptoms worsening last night and describes the chest pain as heavy in nature.  He noted similar symptoms last week, at work. No reports of any nausea, vomiting, or diaphoresis.  No complaints for some lower abdominal cramping pain with intermittent loose stools versus hard firm stools.  Patient reports a recent diagnosis of colitis with a CT scan noted in the chart but has not had follow-up since that time as was suggested.     Physical Exam   Triage Vital Signs: ED Triage Vitals  Encounter Vitals Group     BP 12/16/22 1822 (!) 150/95     Systolic BP Percentile --      Diastolic BP Percentile --      Pulse Rate 12/16/22 1822 93     Resp 12/16/22 1822 18     Temp 12/16/22 1822 98.4 F (36.9 C)     Temp src --      SpO2 12/16/22 1822 100 %     Weight --      Height --      Head Circumference --      Peak Flow --      Pain Score 12/16/22 1821 8     Pain Loc --      Pain Education --      Exclude from Growth Chart --     Most recent vital signs: Vitals:   12/16/22 2125 12/16/22 2223  BP: 120/81   Pulse: 74   Resp:    Temp:  98.3 F (36.8 C)  SpO2: 100%     General Awake, no distress. NAD HEENT NCAT. PERRL. EOMI. No rhinorrhea. Mucous membranes are moist.  CV:  Good peripheral perfusion. RRR RESP:  Normal effort. CTA ABD:  Mildly distention. Soft, nontender. Normal bowel sounds noted   ED Results / Procedures / Treatments    Labs (all labs ordered are listed, but only abnormal results are displayed) Labs Reviewed  BASIC METABOLIC PANEL - Abnormal; Notable for the following components:      Result Value   Potassium 3.3 (*)    Glucose, Bld 103 (*)    All other components within normal limits  GASTROINTESTINAL PANEL BY PCR, STOOL (REPLACES STOOL CULTURE)  C DIFFICILE QUICK SCREEN W PCR REFLEX    CBC  TROPONIN I (HIGH SENSITIVITY)     EKG  Vent. rate 90 BPM PR interval 158 ms QRS duration 84 ms QT/QTcB 350/428 ms P-R-T axes 49 63 33 Normal sinus rhythm Normal ECG When compared with ECG of 07-Jun-2022 17:15, PREVIOUS ECG IS PRESENT  RADIOLOGY  I personally viewed and evaluated these images as part of my medical decision making, as well as reviewing the written report by the radiologist.  ED Provider Interpretation: No acute findings  DG Chest 2 View  Result Date: 12/16/2022 CLINICAL DATA:  Left-sided chest pain for 2 days EXAM: CHEST - 2 VIEW COMPARISON:  02/01/2022 FINDINGS: Frontal  and lateral views of the chest demonstrate an unremarkable cardiac silhouette. No airspace disease, effusion, or pneumothorax. No acute bony abnormalities. IMPRESSION: 1. No acute intrathoracic process. Electronically Signed   By: Sharlet Salina M.D.   On: 12/16/2022 19:17     PROCEDURES:  Critical Care performed: No  Procedures   MEDICATIONS ORDERED IN ED: Medications  dicyclomine (BENTYL) capsule 10 mg (has no administration in time range)     IMPRESSION / MDM / ASSESSMENT AND PLAN / ED COURSE  I reviewed the triage vital signs and the nursing notes.                              Differential diagnosis includes, but is not limited to, ACS, aortic dissection, pulmonary embolism, cardiac tamponade, pneumothorax, pneumonia, pericarditis, myocarditis, GI-related causes including esophagitis/gastritis, and musculoskeletal chest wall pain.    Patient's presentation is most consistent with acute complicated  illness / injury requiring diagnostic workup.  Patient's diagnosis is consistent with specific chest pain and colitis. Patient will be discharged home with prescriptions for until. Patient is to follow up with the GI medicine as discussed, as needed or otherwise directed. Patient is given ED precautions to return to the ED for any worsening or new symptoms.  FINAL CLINICAL IMPRESSION(S) / ED DIAGNOSES   Final diagnoses:  Nonspecific chest pain  Colitis     Rx / DC Orders   ED Discharge Orders          Ordered    dicyclomine (BENTYL) 10 MG capsule  3 times daily before meals & bedtime        12/16/22 2353             Note:  This document was prepared using Dragon voice recognition software and may include unintentional dictation errors.    Lissa Hoard, PA-C 12/17/22 0007    Merwyn Katos, MD 12/19/22 [phone]

## 2022-12-16 NOTE — ED Triage Notes (Signed)
Pt comes with c/o cp that started two days ago. Pt states it got worse last night and felt heavy. Pt states pain to left sided chest and left arm. Pt states no N/V

## 2022-12-16 NOTE — ED Provider Triage Note (Signed)
Emergency Medicine Provider Triage Evaluation Note  Daniel Contreras , a 37 y.o. male  was evaluated in triage.  Pt complains of left sided chest pain for 2 days. It became worse last night. No radiation anywhere.   Review of Systems  Positive: CP, SOB Negative: fever  Physical Exam  BP (!) 150/95   Pulse 93   Temp 98.4 F (36.9 C)   Resp 18   SpO2 100%  Gen:   Awake, no distress   Resp:  Normal effort  MSK:   Moves extremities without difficulty  Other:    Medical Decision Making  Medically screening exam initiated at 6:23 PM.  Appropriate orders placed.  Lance Mollie Calleros was informed that the remainder of the evaluation will be completed by another provider, this initial triage assessment does not replace that evaluation, and the importance of remaining in the ED until their evaluation is complete.     Cameron Ali, PA-C 12/16/22 1824

## 2023-03-14 ENCOUNTER — Other Ambulatory Visit: Payer: Self-pay

## 2023-03-14 ENCOUNTER — Encounter: Payer: Self-pay | Admitting: Emergency Medicine

## 2023-03-14 ENCOUNTER — Emergency Department
Admission: EM | Admit: 2023-03-14 | Discharge: 2023-03-14 | Disposition: A | Discharge status: Home / Self Care | Payer: Self-pay | Attending: Student in an Organized Health Care Education/Training Program | Admitting: Student in an Organized Health Care Education/Training Program

## 2023-03-14 ENCOUNTER — Emergency Department: Payer: Self-pay

## 2023-03-14 DIAGNOSIS — R5381 Other malaise: Secondary | ICD-10-CM | POA: Insufficient documentation

## 2023-03-14 DIAGNOSIS — R109 Unspecified abdominal pain: Secondary | ICD-10-CM | POA: Insufficient documentation

## 2023-03-14 LAB — URINALYSIS, ROUTINE W REFLEX MICROSCOPIC
Bilirubin Urine: NEGATIVE
Glucose, UA: NEGATIVE mg/dL
Hgb urine dipstick: NEGATIVE
Ketones, ur: NEGATIVE mg/dL
Leukocytes,Ua: NEGATIVE
Nitrite: NEGATIVE
Protein, ur: NEGATIVE mg/dL
Specific Gravity, Urine: 1.028 (ref 1.005–1.030)
pH: 5 (ref 5.0–8.0)

## 2023-03-14 LAB — CBC
HCT: 46.1 % (ref 39.0–52.0)
Hemoglobin: 16 g/dL (ref 13.0–17.0)
MCH: 30.8 pg (ref 26.0–34.0)
MCHC: 34.7 g/dL (ref 30.0–36.0)
MCV: 88.7 fL (ref 80.0–100.0)
Platelets: 210 10*3/uL (ref 150–400)
RBC: 5.2 MIL/uL (ref 4.22–5.81)
RDW: 12.7 % (ref 11.5–15.5)
WBC: 6.5 10*3/uL (ref 4.0–10.5)
nRBC: 0 % (ref 0.0–0.2)

## 2023-03-14 LAB — BASIC METABOLIC PANEL
Anion gap: 8 (ref 5–15)
BUN: 25 mg/dL — ABNORMAL HIGH (ref 6–20)
CO2: 25 mmol/L (ref 22–32)
Calcium: 8.9 mg/dL (ref 8.9–10.3)
Chloride: 104 mmol/L (ref 98–111)
Creatinine, Ser: 0.9 mg/dL (ref 0.61–1.24)
GFR, Estimated: >60 mL/min (ref >60)
Glucose, Bld: 123 mg/dL — ABNORMAL HIGH (ref 70–99)
Potassium: 3.8 mmol/L (ref 3.5–5.1)
Sodium: 137 mmol/L (ref 135–145)

## 2023-03-14 MED ORDER — KETOROLAC TROMETHAMINE 30 MG/ML IJ SOLN
30.0000 mg | Freq: Once | INTRAMUSCULAR | Status: AC
  Administered 2023-03-14: 30 mg via INTRAMUSCULAR
  Filled 2023-03-14: qty 1

## 2023-03-14 NOTE — ED Provider Notes (Signed)
[facility] Provider Note    Event Date/Time   First MD Initiated Contact with Patient 03/14/23 [phone]     (approximate)   History   Flank Pain   HPI  Daniel Contreras is a 37 y.o. male presents the ER for evaluation of right flank pain and foul-smelling urine.  Describes some generalized malaise denies any chest pain or shortness of breath.  Pain is worsened with movement.  No diarrhea or constipation.  Denies any hematuria.  Denies any history of kidney stones.  Noes recent antibiotics.     Physical Exam   Triage Vital Signs: ED Triage Vitals  Encounter Vitals Group     BP 03/14/23 0646 126/89     Systolic BP Percentile --      Diastolic BP Percentile --      Pulse Rate 03/14/23 0646 71     Resp 03/14/23 0646 18     Temp 03/14/23 0646 98.3 F (36.8 C)     Temp src --      SpO2 03/14/23 0646 92 %     Weight 03/14/23 0650 176 lb (79.8 kg)     Height 03/14/23 0650 5\' 3"  (1.6 m)     Head Circumference --      Peak Flow --      Pain Score 03/14/23 0650 9     Pain Loc --      Pain Education --      Exclude from Growth Chart --     Most recent vital signs: Vitals:   03/14/23 0646  BP: 126/89  Pulse: 71  Resp: 18  Temp: 98.3 F (36.8 C)  SpO2: 92%     Constitutional: Alert  Eyes: Conjunctivae are normal.  Head: Atraumatic. Nose: No congestion/rhinnorhea. Mouth/Throat: Mucous membranes are moist.   Neck: Painless ROM.  Cardiovascular:   Good peripheral circulation. Respiratory: Normal respiratory effort.  No retractions.  Gastrointestinal: Soft and nontender.  Musculoskeletal:  no deformity Neurologic:  MAE spontaneously. No gross focal neurologic deficits are appreciated.  Skin:  Skin is warm, dry and intact. No rash noted. Psychiatric: Mood and affect are normal. Speech and behavior are normal.    ED Results / Procedures / Treatments   Labs (all labs ordered are listed, but only abnormal results are displayed) Labs  Reviewed  URINALYSIS, ROUTINE W REFLEX MICROSCOPIC - Abnormal; Notable for the following components:      Result Value   Color, Urine YELLOW (*)    APPearance CLEAR (*)    All other components within normal limits  BASIC METABOLIC PANEL - Abnormal; Notable for the following components:   Glucose, Bld 123 (*)    BUN 25 (*)    All other components within normal limits  CBC     EKG     RADIOLOGY Please see ED Course for my review and interpretation.  I personally reviewed all radiographic images ordered to evaluate for the above acute complaints and reviewed radiology reports and findings.  These findings were personally discussed with the patient.  Please see medical record for radiology report.    PROCEDURES:  Critical Care performed: No  Procedures   MEDICATIONS ORDERED IN ED: Medications  ketorolac (TORADOL) 30 MG/ML injection 30 mg (30 mg Intramuscular Given 03/14/23 0824)     IMPRESSION / MDM / ASSESSMENT AND PLAN / ED COURSE  I reviewed the triage vital signs and the nursing notes.  Differential diagnosis includes, but is not limited to, stone pyelonephritis, musculoskeletal strain, colitis, appendicitis, AAA  Patient presenting to the ER for evaluation of symptoms as described above.  Based on symptoms, risk factors and considered above differential, this presenting complaint could reflect a potentially life-threatening illness therefore the patient will be placed on continuous pulse oximetry and telemetry for monitoring.  Laboratory evaluation will be sent to evaluate for the above complaints.  His exam is reassuring.  No guarding or rebound.  No tenderness on the palpation.  Has had multiple CTs in the past for similar symptoms all of which have been negative.  No history of AAA.  Doubt vascular pathology.  Possible musculoskeletal strain will give Toradol will check urine.  Will order x-ray to evaluate for obstruction or stone.  Doubt  biliary pathology.  Does not seem consistent with appendicitis.   Clinical Course as of 03/14/23 0911  Sat Mar 14, 2023  1308 X-ray KUB BMI review and interpretation without evidence of stone.  Urinalysis without evidence infection. [PR]  0909 Reassessed.  Well-appearing in no acute distress.  Does appear stable and appropriate for outpatient follow-up. [PR]    Clinical Course User Index [PR] Willy Eddy, MD     FINAL CLINICAL IMPRESSION(S) / ED DIAGNOSES   Final diagnoses:  Right flank pain     Rx / DC Orders   ED Discharge Orders     None        Note:  This document was prepared using Dragon voice recognition software and may include unintentional dictation errors.    Willy Eddy, MD 03/14/23 [phone]

## 2023-03-14 NOTE — ED Triage Notes (Signed)
Pt to ED from home c/o right flank pain since Wednesday and getting worse, states foul smelling urine as well.  Denies hx of kidney stones.

## 2023-04-03 ENCOUNTER — Other Ambulatory Visit: Payer: Self-pay

## 2023-04-03 ENCOUNTER — Emergency Department: Payer: Self-pay

## 2023-04-03 ENCOUNTER — Emergency Department
Admission: EM | Admit: 2023-04-03 | Discharge: 2023-04-03 | Disposition: A | Discharge status: Home / Self Care | Payer: Self-pay | Attending: Emergency Medicine | Admitting: Emergency Medicine

## 2023-04-03 DIAGNOSIS — W208XXA Other cause of strike by thrown, projected or falling object, initial encounter: Secondary | ICD-10-CM | POA: Insufficient documentation

## 2023-04-03 DIAGNOSIS — S6000XA Contusion of unspecified finger without damage to nail, initial encounter: Secondary | ICD-10-CM

## 2023-04-03 DIAGNOSIS — S60032A Contusion of left middle finger without damage to nail, initial encounter: Secondary | ICD-10-CM | POA: Insufficient documentation

## 2023-04-03 NOTE — Discharge Instructions (Signed)
 Your x-ray was negative.  Please follow-up with orthopedics if your symptoms persist.  You may wear the finger splint for extra support.  Please return for any new, worsening, or change in symptoms or other concerns.  It was a pleasure caring for you today

## 2023-04-03 NOTE — ED Triage Notes (Signed)
 Pt comes with c/o right middle finger pain. Pt states he dropped his brakes to his car on it while working. Pt has swelling noted.

## 2023-04-03 NOTE — ED Provider Notes (Signed)
 [facility] Provider Note    Event Date/Time   First MD Initiated Contact with Patient 04/03/23 1503     (approximate)   History   Finger Injury   HPI  Daniel Contreras is a 38 y.o. male who presents today for evaluation of the left third finger pain.  Patient reports that he dropped his brakes on his finger while he was working on his car.  He reports that he still able to flex and extend his finger normally, but has pain to his middle phalanx.  No numbness or tingling.  Patient Active Problem List   Diagnosis Date Noted   Seizure (HCC) 02/14/2019   Scrotal injury, initial encounter 12/15/2017          Physical Exam   Triage Vital Signs: ED Triage Vitals  Encounter Vitals Group     BP 04/03/23 1156 129/72     Systolic BP Percentile --      Diastolic BP Percentile --      Pulse Rate 04/03/23 1156 63     Resp 04/03/23 1156 18     Temp 04/03/23 1156 98 F (36.7 C)     Temp src --      SpO2 04/03/23 1156 94 %     Weight 04/03/23 1155 170 lb (77.1 kg)     Height 04/03/23 1155 5' 3 (1.6 m)     Head Circumference --      Peak Flow --      Pain Score 04/03/23 1155 8     Pain Loc --      Pain Education --      Exclude from Growth Chart --     Most recent vital signs: Vitals:   04/03/23 1156 04/03/23 1513  BP: 129/72 127/78  Pulse: 63 73  Resp: 18 19  Temp: 98 F (36.7 C) 98 F (36.7 C)  SpO2: 94% 98%    Physical Exam Vitals and nursing note reviewed.  Constitutional:      General: Awake and alert. No acute distress.    Appearance: Normal appearance. The patient is normal weight.  HENT:     Head: Normocephalic and atraumatic.     Mouth: Mucous membranes are moist.  Eyes:     General: PERRL. Normal EOMs        Right eye: No discharge.        Left eye: No discharge.     Conjunctiva/sclera: Conjunctivae normal.  Cardiovascular:     Rate and Rhythm: Normal rate and regular rhythm.     Pulses: Normal pulses.  Pulmonary:      Effort: Pulmonary effort is normal. No respiratory distress.     Breath sounds: Normal breath sounds.  Abdominal:     Abdomen is soft. There is no abdominal tenderness. No rebound or guarding. No distention. Musculoskeletal:        General: No swelling. Normal range of motion.     Cervical back: Normal range of motion and neck supple. Right hand: No obvious deformity, erythema, ecchymosis.  Third finger with superficial abrasions to the dorsum of the finger over the middle phalanx without bleeding.  Patient has tenderness to palpation to the middle phalanx.  He is able to flex and extend at isolated PIP, DIP, and MCP against resistance.  He is able to give thumbs up, cross fingers, make okay sign and hold closed against resistance.  No malrotation.  Normal grip strength.  No tenderness to wrist, forearm, or elbow.  Skin:    General: Skin is warm and dry.     Capillary Refill: Capillary refill takes less than 2 seconds.     Findings: No rash.  Neurological:     Mental Status: The patient is awake and alert.      ED Results / Procedures / Treatments   Labs (all labs ordered are listed, but only abnormal results are displayed) Labs Reviewed - No data to display   EKG     RADIOLOGY I independently reviewed and interpreted imaging and agree with radiologists findings.     PROCEDURES:  Critical Care performed:   Procedures   MEDICATIONS ORDERED IN ED: Medications - No data to display   IMPRESSION / MDM / ASSESSMENT AND PLAN / ED COURSE  I reviewed the triage vital signs and the nursing notes.   Differential diagnosis includes, but is not limited to, contusion, fracture, dislocation, tendon injury.  Patient is awake and alert, hemodynamically stable and afebrile.  He is nontoxic in appearance.  He has full and normal range of motion of his finger, normal flexion and extension at isolated DIP and PIP, no finger lag, I do not suspect tendon rupture at this time.  He has no  malrotation, he has normal grip strength.  X-ray obtained reveals no osseous injury.  Patient was put in a splint for further support.  We discussed return precautions the importance of close outpatient follow-up.  He was given the information for ortho/hand in case his symptoms persist.  Patient understands and agrees with plan.  He was discharged in stable condition.   Patient's presentation is most consistent with acute complicated illness / injury requiring diagnostic workup.    FINAL CLINICAL IMPRESSION(S) / ED DIAGNOSES   Final diagnoses:  Contusion of finger without damage to nail, unspecified finger, unspecified laterality, initial encounter     Rx / DC Orders   ED Discharge Orders     None        Note:  This document was prepared using Dragon voice recognition software and may include unintentional dictation errors.   Aubra Pappalardo E, PA-C 04/03/23 1703    Suzanne Kirsch, MD 04/04/23 [phone]

## 2023-10-12 ENCOUNTER — Other Ambulatory Visit: Payer: Self-pay

## 2023-10-12 ENCOUNTER — Emergency Department
Admission: EM | Admit: 2023-10-12 | Discharge: 2023-10-12 | Disposition: A | Discharge status: Home / Self Care | Payer: Self-pay | Attending: Emergency Medicine | Admitting: Emergency Medicine

## 2023-10-12 ENCOUNTER — Emergency Department: Payer: Self-pay

## 2023-10-12 DIAGNOSIS — W228XXA Striking against or struck by other objects, initial encounter: Secondary | ICD-10-CM | POA: Insufficient documentation

## 2023-10-12 DIAGNOSIS — S81811A Laceration without foreign body, right lower leg, initial encounter: Secondary | ICD-10-CM | POA: Insufficient documentation

## 2023-10-12 DIAGNOSIS — Z23 Encounter for immunization: Secondary | ICD-10-CM | POA: Insufficient documentation

## 2023-10-12 MED ORDER — TETANUS-DIPHTH-ACELL PERTUSSIS 5-2.5-18.5 LF-MCG/0.5 IM SUSY
0.5000 mL | PREFILLED_SYRINGE | Freq: Once | INTRAMUSCULAR | Status: AC
  Administered 2023-10-12: 0.5 mL via INTRAMUSCULAR
  Filled 2023-10-12: qty 0.5

## 2023-10-12 MED ORDER — LIDOCAINE HCL (PF) 1 % IJ SOLN
5.0000 mL | Freq: Once | INTRAMUSCULAR | Status: AC
  Administered 2023-10-12: 5 mL
  Filled 2023-10-12: qty 5

## 2023-10-12 NOTE — ED Provider Notes (Addendum)
 [facility] Provider Note    Event Date/Time   First MD Initiated Contact with Patient 10/12/23 2041     (approximate)   History   Leg Injury    HPI  Daniel Contreras is a 38 y.o. male    with a past medical history of right flank pain, contusion of finger, chest pain, colitis, epigastric abdominal pain, seizures, rectal pain, testicle pain, GERD, generalized seizures,  who presents to the ED complaining of laceration on the leg. According to the patient, he was moving a stove, he cut his leg with a lower drawer.  Patient states last Tdap was 4 years ago.  Per independent chart review I could not find Tdap date.      Patient Active Problem List   Diagnosis Date Noted   Seizure (HCC) 02/14/2019   Scrotal injury, initial encounter 12/15/2017      ROS: Patient currently denies any vision changes, tinnitus, difficulty speaking, facial droop, sore throat, chest pain, shortness of breath, abdominal pain, nausea/vomiting/diarrhea, dysuria, or weakness/numbness/paresthesias in any extremity   Physical Exam   Triage Vital Signs: ED Triage Vitals  Encounter Vitals Group     BP 10/12/23 1925 115/71     Girls Systolic BP Percentile --      Girls Diastolic BP Percentile --      Boys Systolic BP Percentile --      Boys Diastolic BP Percentile --      Pulse Rate 10/12/23 1925 76     Resp 10/12/23 1925 18     Temp 10/12/23 1925 98.6 F (37 C)     Temp src --      SpO2 10/12/23 1925 98 %     Weight 10/12/23 1929 165 lb (74.8 kg)     Height 10/12/23 1929 5' 3 (1.6 m)     Head Circumference --      Peak Flow --      Pain Score 10/12/23 1928 8     Pain Loc --      Pain Education --      Exclude from Growth Chart --     Most recent vital signs: Vitals:   10/12/23 1925  BP: 115/71  Pulse: 76  Resp: 18  Temp: 98.6 F (37 C)  SpO2: 98%     Physical Exam Vitals and nursing note reviewed.  Vital signs were normal during triage  Constitutional:       General: Awake and alert. No acute distress.    Appearance: Normal appearance. The patient is normal weight.      Able to speak in complete sentences without cough or dyspnea  HENT:     Head: Normocephalic and atraumatic.     Mouth: Mucous membranes are moist.  Eyes:     General: PERRL. Normal EOMs          Conjunctiva/sclera: Conjunctivae normal.  Nose No congestion/rhinorrhea  CV:                  Good peripheral perfusion.  Regular rate and rhythm  Resp:               Normal effort.  Equal breath sounds bilaterally.  Abd:                 No distention.  Soft, nontender.  No rebound or guarding.  Musculoskeletal:        General: No swelling. Normal range of motion.  Right lower leg: Presence of  laceration about 4 cm length, 2 mm depth, no active bleeding.  See media Skin:    General: Skin is warm and dry.     Capillary Refill: Capillary refill takes less than 2 seconds.     Findings: No rash.  Neurological:     Mental Status: The patient is awake and alert. MAE spontaneously. No gross focal neurologic deficits are appreciated.  Psychiatric Mood and affect are normal. Speech and behavior are normal.  ED Results / Procedures / Treatments   Labs (all labs ordered are listed, but only abnormal results are displayed) Labs Reviewed - No data to display   EKG     RADIOLOGY I independently reviewed and interpreted imaging and agree with radiologists findings.      PROCEDURES:  Critical Care performed:   .Laceration Repair  Date/Time: 10/12/2023 10:05 PM  Performed by: Janit Kast, PA-C Authorized by: Janit Kast, PA-C   Consent:    Consent obtained:  Verbal   Consent given by:  Patient   Risks, benefits, and alternatives were discussed: yes     Risks discussed:  Infection and pain Universal protocol:    Procedure explained and questions answered to patient or proxy's satisfaction: yes     Patient identity confirmed:  Verbally with  patient Anesthesia:    Anesthesia method:  Local infiltration   Local anesthetic:  Lidocaine  1% WITH epi Laceration details:    Location:  Leg   Leg location:  R lower leg   Length (cm):  4   Depth (mm):  2 Pre-procedure details:    Preparation:  Patient was prepped and draped in usual sterile fashion Exploration:    Limited defect created (wound extended): no     Hemostasis achieved with:  Direct pressure   Contaminated: no   Treatment:    Area cleansed with:  Povidone-iodine   Amount of cleaning:  Standard   Irrigation solution:  Sterile saline   Irrigation volume:  300   Irrigation method:  Tap   Visualized foreign bodies/material removed: no     Debridement:  None Skin repair:    Repair method:  Sutures   Suture size:  5-0   Suture material:  Nylon   Number of sutures:  9 Approximation:    Approximation:  Close Repair type:    Repair type:  Simple Post-procedure details:    Dressing:  Non-adherent dressing   Procedure completion:  Tolerated well, no immediate complications    MEDICATIONS ORDERED IN ED: Medications  lidocaine  (PF) (XYLOCAINE ) 1 % injection 5 mL (5 mLs Other Given 10/12/23 2147)  Tdap (BOOSTRIX) injection 0.5 mL (0.5 mLs Intramuscular Given 10/12/23 2147)   Clinical Course as of 10/12/23 2207  Mon Oct 12, 2023  2203 DG Tibia/Fibula Right  Anterior soft tissue laceration. No retained radiopaque foreign body.   [AE]    Clinical Course User Index [AE] Janit Kast, PA-C    IMPRESSION / MDM / ASSESSMENT AND PLAN / ED COURSE  I reviewed the triage vital signs and the nursing notes.  Differential diagnosis includes, but is not limited to, laceration, avulsion, foreign body, fracture  Patient's presentation is most consistent with acute complicated illness / injury requiring diagnostic workup.   Daniel Contreras is a 38 y.o., male resents today with history of laceration to right lower leg with a metal part of a stove.  Physical exam  there is presence of laceration about 4 cm length, 2 mm depth no active bleeding.  Patient's diagnosis is  consistent with laceration right lower leg. I independently reviewed and interpreted imaging and agree with radiologists findings.  I did not order any labs, physical exam is reassuring. I did review the patient's allergies and medications.During admission patient received Tdap booster the patient is in stable and satisfactory condition for discharge home  Patient will be discharged home without prescriptions. Patient is to follow up with PCP as needed or otherwise directed. Patient is given ED precautions to return to the ED for any worsening or new symptoms.  Patient does not have insurance, patient does not have PCP I [facility] and for removal of sutures in 6 days.  Discussed plan of care with patient, answered all of patient's questions, Patient agreeable to plan of care. Advised patient to take medications according to the instructions on the label. Discussed possible side effects of new medications. Patient verbalized understanding.    FINAL CLINICAL IMPRESSION(S) / ED DIAGNOSES   Final diagnoses:  Laceration of right lower leg, initial encounter     Rx / DC Orders   ED Discharge Orders     None        Note:  This document was prepared using Dragon voice recognition software and may include unintentional dictation errors.   Janit Kast, PA-C 10/12/23 2207    Malvina Alm DASEN, MD 10/12/23 2210    Janit Kast, PA-C 10/12/23 2211    Janit Kast, PA-C 10/12/23 2212    Malvina Alm DASEN, MD 10/12/23 [phone]

## 2023-10-12 NOTE — ED Triage Notes (Signed)
 Says he was hit in the right leg with a metal stool.   Abrasion to right leg and ~5cm laceration just below right knee.

## 2023-10-12 NOTE — Discharge Instructions (Signed)
 You have been diagnosed with laceration of right lower leg.  Please do not submerge your leg in water.  You can remove the dressing in 2 days.  [facility] in 6 days.  Please come back to ED or go to your PCP if you have new symptoms or symptoms worsen.  You can establish care with at [facility].

## 2023-11-05 ENCOUNTER — Emergency Department: Payer: Self-pay

## 2023-11-05 ENCOUNTER — Emergency Department
Admission: EM | Admit: 2023-11-05 | Discharge: 2023-11-05 | Disposition: A | Discharge status: Home / Self Care | Payer: Self-pay | Attending: Emergency Medicine | Admitting: Emergency Medicine

## 2023-11-05 ENCOUNTER — Other Ambulatory Visit: Payer: Self-pay

## 2023-11-05 DIAGNOSIS — Y92009 Unspecified place in unspecified non-institutional (private) residence as the place of occurrence of the external cause: Secondary | ICD-10-CM | POA: Insufficient documentation

## 2023-11-05 DIAGNOSIS — S91111A Laceration without foreign body of right great toe without damage to nail, initial encounter: Secondary | ICD-10-CM | POA: Insufficient documentation

## 2023-11-05 DIAGNOSIS — W208XXA Other cause of strike by thrown, projected or falling object, initial encounter: Secondary | ICD-10-CM | POA: Insufficient documentation

## 2023-11-05 DIAGNOSIS — S91219A Laceration without foreign body of unspecified toe(s) with damage to nail, initial encounter: Secondary | ICD-10-CM

## 2023-11-05 DIAGNOSIS — S91114A Laceration without foreign body of right lesser toe(s) without damage to nail, initial encounter: Secondary | ICD-10-CM | POA: Insufficient documentation

## 2023-11-05 DIAGNOSIS — Y9389 Activity, other specified: Secondary | ICD-10-CM | POA: Insufficient documentation

## 2023-11-05 MED ORDER — OXYCODONE-ACETAMINOPHEN 5-325 MG PO TABS
1.0000 | ORAL_TABLET | Freq: Once | ORAL | Status: AC
  Administered 2023-11-05: 1 via ORAL
  Filled 2023-11-05: qty 1

## 2023-11-05 MED ORDER — LIDOCAINE HCL (PF) 1 % IJ SOLN
5.0000 mL | Freq: Once | INTRAMUSCULAR | Status: AC
  Administered 2023-11-05: 5 mL via INTRADERMAL
  Filled 2023-11-05: qty 5

## 2023-11-05 MED ORDER — HYDROCODONE-ACETAMINOPHEN 5-325 MG PO TABS
1.0000 | ORAL_TABLET | Freq: Four times a day (QID) | ORAL | 0 refills | Status: AC | PRN
Start: 2023-11-05 — End: 2023-11-08

## 2023-11-05 NOTE — ED Provider Notes (Signed)
 [facility] Provider Note    Event Date/Time   First MD Initiated Contact with Patient 11/05/23 2021     (approximate)   History   Foot Injury   HPI  Daniel Contreras is a 38 y.o. male with PMH of seizures who presents for evaluation of an injury to his right foot.  Patient was doing some work at home when he dropped a cement block on his right foot.  He is having significant pain to the area and difficulty with walking.  He has lots of bleeding on the foot and his big toenail is raised up.      Physical Exam   Triage Vital Signs: ED Triage Vitals  Encounter Vitals Group     BP 11/05/23 1843 130/86     Girls Systolic BP Percentile --      Girls Diastolic BP Percentile --      Boys Systolic BP Percentile --      Boys Diastolic BP Percentile --      Pulse Rate 11/05/23 1843 70     Resp 11/05/23 1843 16     Temp 11/05/23 1843 98.3 F (36.8 C)     Temp src --      SpO2 11/05/23 1843 98 %     Weight --      Height --      Head Circumference --      Peak Flow --      Pain Score 11/05/23 1842 10     Pain Loc --      Pain Education --      Exclude from Growth Chart --     Most recent vital signs: Vitals:   11/05/23 1849 11/05/23 2323  BP:  120/89  Pulse:  76  Resp:  20  Temp:  98 F (36.7 C)  SpO2: 100% 100%   General: Awake, very uncomfortable on exam. CV:  Good peripheral perfusion.  Resp:  Normal effort.  Abd:  No distention.  Other:  Right great toe has a large blood clot on top of the nailbed with the toenail hanging off, neurovascularly intact distal to the wound, approximately 2 cm laceration to the base of the second toe on the right foot, neurovascularly intact to this wound, toe range of motion well-maintained throughout the foot, dorsalis pedis pulse 2+ and regular   ED Results / Procedures / Treatments   Labs (all labs ordered are listed, but only abnormal results are displayed) Labs Reviewed - No data to  display   RADIOLOGY  Right foot x-ray obtained, I interpreted the images as well as reviewed the radiologist report which was negative for fractures.  PROCEDURES:  Critical Care performed: No  .Laceration Repair  Date/Time: 11/05/2023 11:40 PM  Performed by: Cleaster Tinnie LABOR, PA-C Authorized by: Cleaster Tinnie LABOR, PA-C   Consent:    Consent obtained:  Verbal   Consent given by:  Patient   Risks, benefits, and alternatives were discussed: yes     Risks discussed:  Infection, pain, poor cosmetic result, poor wound healing and need for additional repair   Alternatives discussed:  No treatment Universal protocol:    Patient identity confirmed:  Verbally with patient Anesthesia:    Anesthesia method:  Nerve block   Block location:  Base of right great toe   Block needle gauge:  25 G   Block anesthetic:  Lidocaine  1% w/o epi   Block technique:  Digital block   Block injection  procedure:  Anatomic landmarks identified, introduced needle and incremental injection   Block outcome:  Anesthesia achieved Laceration details:    Location:  Toe   Toe location:  R big toe   Length (cm):  1   Depth (mm):  2 Exploration:    Hemostasis achieved with:  Tourniquet   Imaging obtained: x-ray     Imaging outcome: foreign body noted     Contaminated: yes   Treatment:    Area cleansed with:  Povidone-iodine   Amount of cleaning:  Extensive   Irrigation solution:  Sterile saline   Irrigation method:  Syringe   Debridement:  Minimal Skin repair:    Repair method:  Sutures   Suture size:  5-0   Wound skin closure material used: vicryl.   Suture technique:  Simple interrupted   Number of sutures:  3 Approximation:    Approximation:  Loose Repair type:    Repair type:  Intermediate Post-procedure details:    Dressing:  Adhesive bandage   Procedure completion:  Tolerated well, no immediate complications .Laceration Repair  Date/Time: 11/05/2023 11:44 PM  Performed by: Cleaster Tinnie LABOR, PA-C Authorized by: Cleaster Tinnie LABOR, PA-C   Consent:    Consent obtained:  Verbal   Consent given by:  Patient   Risks, benefits, and alternatives were discussed: yes     Risks discussed:  Infection, pain, poor wound healing and poor cosmetic result   Alternatives discussed:  No treatment Universal protocol:    Patient identity confirmed:  Verbally with patient Anesthesia:    Anesthesia method:  Nerve block   Block location:  Base of right 2nd toe   Block needle gauge:  25 G   Block anesthetic:  Lidocaine  1% w/o epi   Block technique:  Digital block   Block injection procedure:  Anatomic landmarks identified, introduced needle and incremental injection   Block outcome:  Anesthesia achieved Laceration details:    Location:  Toe   Toe location:  R second toe   Length (cm):  2   Depth (mm):  3 Pre-procedure details:    Preparation:  Imaging obtained to evaluate for foreign bodies Exploration:    Hemostasis achieved with:  Direct pressure   Imaging outcome: foreign body noted     Wound exploration: wound explored through full range of motion and entire depth of wound visualized     Contaminated: no   Treatment:    Area cleansed with:  Povidone-iodine   Amount of cleaning:  Standard   Irrigation solution:  Sterile saline   Irrigation method:  Syringe Skin repair:    Repair method:  Sutures   Suture size:  4-0   Suture material:  Prolene   Suture technique:  Simple interrupted   Number of sutures:  4 Approximation:    Approximation:  Close Repair type:    Repair type:  Simple Post-procedure details:    Dressing:  Adhesive bandage   Procedure completion:  Tolerated well, no immediate complications    MEDICATIONS ORDERED IN ED: Medications  lidocaine  (PF) (XYLOCAINE ) 1 % injection 5 mL (5 mLs Intradermal Given by Other 11/05/23 2255)  oxyCODONE -acetaminophen  (PERCOCET/ROXICET) 5-325 MG per tablet 1 tablet (1 tablet Oral Given 11/05/23 2107)  lidocaine  (PF)  (XYLOCAINE ) 1 % injection 5 mL (5 mLs Intradermal Given by Other 11/05/23 2256)     IMPRESSION / MDM / ASSESSMENT AND PLAN / ED COURSE  I reviewed the triage vital signs and the nursing notes.  38 year old male presents for evaluation of a right foot injury. VSS but patient is very uncomfortable on exam.   Differential diagnosis includes, but is not limited to, toe fracture, crush injury, nailbed laceration, toe laceration, nail avulsion.   Patient's presentation is most consistent with acute complicated illness / injury requiring diagnostic workup.  Foot x-ray was negative for fractures.  Patient had a nailbed laceration in the right great toe which was repaired using sutures, then I reattached patient's toenail for protection.  Laceration on the bottom of the second right toe was also repaired.  See procedure note for details.  Patient was up-to-date on his tetanus shot.  We discussed wound care.  I recommended that he follow-up with podiatry.  We discussed when to have the sutures removed.  He voiced understanding, all questions were answered and he was stable at discharge.    FINAL CLINICAL IMPRESSION(S) / ED DIAGNOSES   Final diagnoses:  Laceration of nail bed of toe, initial encounter  Laceration of lesser toe of right foot without foreign body present or damage to nail, initial encounter     Rx / DC Orders   ED Discharge Orders          Ordered    HYDROcodone -acetaminophen  (NORCO/VICODIN) 5-325 MG tablet  Every 6 hours PRN        11/05/23 2309             Note:  This document was prepared using Dragon voice recognition software and may include unintentional dictation errors.   Cleaster Tinnie LABOR, PA-C 11/05/23 2346    Dorothyann Drivers, MD 11/07/23 1352

## 2023-11-05 NOTE — ED Triage Notes (Addendum)
 Pt to ED via POV from home. Pt reports dropped a cement block on right foot. Pt has bleeding and toenail raised on right big toe. Upon coming into ED pt fell over weapons detector due to pain. Pt placed on stretcher by staff. Pt denies hitting his head or LOC. Pt reports pain 10/10.

## 2023-11-05 NOTE — Discharge Instructions (Addendum)
 The stitches on your second toe will need to be removed in 12-14 days. The stitches in your toe nail will need to be removed in 2-3 weeks. Please schedule a follow up appointment with podiatry whose information is attached.  They can do a wound evaluation and can remove the stitches.  The stitches placed in your nailbed underneath your toenail of your big toe are dissolvable and will be absorbed by your body in a few weeks.  Please wash your foot with soap and water daily, pat dry and cover the wounds antibiotic ointment and with a bandage.  If the bandage becomes wet or dirty please change it out for another 1. Watch for signs of infection including redness, warmth, swelling, pain and pus drainage.  If you develop any of these please return to the ED, urgent care or your primary care provider.  You can take 650 mg of Tylenol  and 600 mg of ibuprofen  every 6 hours as needed for pain. You can use ice, heat, muscle creams and other topical pain relievers as well.  I have sent a strong pain medication called Norco to the pharmacy.  This medication can be taken every 4-6 hours as needed for severe or breakthrough pain.  This medication can cause dependency so only take if you are unable to control your pain with other medications.  It will make you sleepy so do not drive or operate heavy machinery after taking it.  Do not drink alcohol while taking this medication.  This medication can also cause constipation so please take an over-the-counter stool softener like MiraLAX or Colace while taking it.  This medication contains both hydrocodone  and acetaminophen .  Do not take Tylenol  at the same time.  You can take the Norco or Tylenol  but not both.

## 2024-04-19 ENCOUNTER — Observation Stay: Admit: 2024-04-19 | Discharge: 2024-04-19 | Disposition: A | Payer: Self-pay | Attending: Internal Medicine

## 2024-04-19 ENCOUNTER — Emergency Department: Payer: Self-pay

## 2024-04-19 ENCOUNTER — Other Ambulatory Visit: Payer: Self-pay

## 2024-04-19 ENCOUNTER — Observation Stay: Payer: Self-pay

## 2024-04-19 ENCOUNTER — Observation Stay
Admission: EM | Admit: 2024-04-19 | Discharge: 2024-04-19 | Disposition: A | Discharge status: Home / Self Care | Payer: Self-pay | Attending: Internal Medicine | Admitting: Internal Medicine

## 2024-04-19 DIAGNOSIS — R29818 Other symptoms and signs involving the nervous system: Secondary | ICD-10-CM

## 2024-04-19 DIAGNOSIS — R449 Unspecified symptoms and signs involving general sensations and perceptions: Secondary | ICD-10-CM

## 2024-04-19 DIAGNOSIS — R519 Headache, unspecified: Secondary | ICD-10-CM

## 2024-04-19 DIAGNOSIS — M316 Other giant cell arteritis: Secondary | ICD-10-CM

## 2024-04-19 DIAGNOSIS — Z8669 Personal history of other diseases of the nervous system and sense organs: Secondary | ICD-10-CM

## 2024-04-19 DIAGNOSIS — R531 Weakness: Secondary | ICD-10-CM | POA: Insufficient documentation

## 2024-04-19 DIAGNOSIS — Z79899 Other long term (current) drug therapy: Secondary | ICD-10-CM | POA: Insufficient documentation

## 2024-04-19 DIAGNOSIS — G43109 Migraine with aura, not intractable, without status migrainosus: Principal | ICD-10-CM | POA: Diagnosis present

## 2024-04-19 DIAGNOSIS — H539 Unspecified visual disturbance: Principal | ICD-10-CM

## 2024-04-19 DIAGNOSIS — G40909 Epilepsy, unspecified, not intractable, without status epilepticus: Secondary | ICD-10-CM | POA: Insufficient documentation

## 2024-04-19 LAB — URINE DRUG SCREEN
Amphetamines: NEGATIVE
Barbiturates: NEGATIVE
Benzodiazepines: NEGATIVE
Cocaine: NEGATIVE
Fentanyl: NEGATIVE
Methadone Scn, Ur: NEGATIVE
Opiates: NEGATIVE
Tetrahydrocannabinol: NEGATIVE

## 2024-04-19 LAB — LIPID PANEL
Cholesterol: 201 mg/dL — ABNORMAL HIGH (ref 0–200)
HDL: 38 mg/dL — ABNORMAL LOW
LDL Cholesterol: 150 mg/dL — ABNORMAL HIGH (ref 0–99)
Total CHOL/HDL Ratio: 5.3 ratio
Triglycerides: 65 mg/dL
VLDL: 13 mg/dL (ref 0–40)

## 2024-04-19 LAB — ECHOCARDIOGRAM COMPLETE
AR max vel: 2.37 cm2
AV Area VTI: 2.11 cm2
AV Area mean vel: 2.31 cm2
AV Mean grad: 4 mmHg
AV Peak grad: 6.3 mmHg
Ao pk vel: 1.25 m/s
Area-P 1/2: 3.77 cm2
Height: 68 in
MV VTI: 3.09 cm2
S' Lateral: 3.5 cm
Weight: 3054.69 [oz_av]

## 2024-04-19 LAB — COMPREHENSIVE METABOLIC PANEL WITH GFR
ALT: 91 U/L — ABNORMAL HIGH (ref 0–44)
AST: 39 U/L (ref 15–41)
Albumin: 4.5 g/dL (ref 3.5–5.0)
Alkaline Phosphatase: 89 U/L (ref 38–126)
Anion gap: 13 (ref 5–15)
BUN: 23 mg/dL — ABNORMAL HIGH (ref 6–20)
CO2: 22 mmol/L (ref 22–32)
Calcium: 8.9 mg/dL (ref 8.9–10.3)
Chloride: 100 mmol/L (ref 98–111)
Creatinine, Ser: 1.02 mg/dL (ref 0.61–1.24)
GFR, Estimated: >60 mL/min
Glucose, Bld: 141 mg/dL — ABNORMAL HIGH (ref 70–99)
Potassium: 3.5 mmol/L (ref 3.5–5.1)
Sodium: 135 mmol/L (ref 135–145)
Total Bilirubin: 0.3 mg/dL (ref 0.0–1.2)
Total Protein: 7.3 g/dL (ref 6.5–8.1)

## 2024-04-19 LAB — APTT: aPTT: 34 s (ref 24–36)

## 2024-04-19 LAB — DIFFERENTIAL
Abs Immature Granulocytes: 0.05 K/uL (ref 0.00–0.07)
Basophils Absolute: 0 K/uL (ref 0.0–0.1)
Basophils Relative: 0 %
Eosinophils Absolute: 0.1 K/uL (ref 0.0–0.5)
Eosinophils Relative: 2 %
Immature Granulocytes: 1 %
Lymphocytes Relative: 38 %
Lymphs Abs: 3.5 K/uL (ref 0.7–4.0)
Monocytes Absolute: 1.1 K/uL — ABNORMAL HIGH (ref 0.1–1.0)
Monocytes Relative: 12 %
Neutro Abs: 4.3 K/uL (ref 1.7–7.7)
Neutrophils Relative %: 47 %

## 2024-04-19 LAB — CBC
HCT: 47.6 % (ref 39.0–52.0)
Hemoglobin: 16.2 g/dL (ref 13.0–17.0)
MCH: 30.6 pg (ref 26.0–34.0)
MCHC: 34 g/dL (ref 30.0–36.0)
MCV: 89.8 fL (ref 80.0–100.0)
Platelets: 220 K/uL (ref 150–400)
RBC: 5.3 MIL/uL (ref 4.22–5.81)
RDW: 13 % (ref 11.5–15.5)
WBC: 9.2 K/uL (ref 4.0–10.5)
nRBC: 0 % (ref 0.0–0.2)

## 2024-04-19 LAB — HEMOGLOBIN A1C
Hgb A1c MFr Bld: 5.7 % — ABNORMAL HIGH (ref 4.8–5.6)
Mean Plasma Glucose: 116.89 mg/dL

## 2024-04-19 LAB — PROTIME-INR
INR: 1 (ref 0.8–1.2)
Prothrombin Time: 13.9 s (ref 11.4–15.2)

## 2024-04-19 LAB — HIV ANTIBODY (ROUTINE TESTING W REFLEX): HIV Screen 4th Generation wRfx: REACTIVE — AB

## 2024-04-19 MED ORDER — ENOXAPARIN SODIUM 40 MG/0.4ML IJ SOSY
40.0000 mg | PREFILLED_SYRINGE | INTRAMUSCULAR | Status: DC
  Administered 2024-04-19: 40 mg via SUBCUTANEOUS
  Filled 2024-04-19: qty 0.4

## 2024-04-19 MED ORDER — ASPIRIN 81 MG PO TBEC: 81.0000 mg | DELAYED_RELEASE_TABLET | Freq: Every day | ORAL | Status: DC

## 2024-04-19 MED ORDER — IOHEXOL 350 MG/ML SOLN
100.0000 mL | Freq: Once | INTRAVENOUS | Status: AC | PRN
  Administered 2024-04-19: 100 mL via INTRAVENOUS

## 2024-04-19 MED ORDER — PANTOPRAZOLE SODIUM 40 MG PO TBEC
40.0000 mg | DELAYED_RELEASE_TABLET | Freq: Every day | ORAL | Status: DC
  Administered 2024-04-19: 40 mg via ORAL
  Filled 2024-04-19: qty 1

## 2024-04-19 MED ORDER — PANTOPRAZOLE SODIUM 40 MG PO TBEC
40.0000 mg | DELAYED_RELEASE_TABLET | Freq: Every day | ORAL | 11 refills | Status: AC
  Filled 2024-04-19: qty 30, 30d supply, fill #0

## 2024-04-19 MED ORDER — ACETAMINOPHEN 500 MG PO TABS
1000.0000 mg | ORAL_TABLET | Freq: Once | ORAL | Status: AC
  Administered 2024-04-19: 1000 mg via ORAL
  Filled 2024-04-19: qty 2

## 2024-04-19 MED ORDER — ACETAMINOPHEN 650 MG RE SUPP: 650.0000 mg | RECTAL | Status: DC | PRN

## 2024-04-19 MED ORDER — METOCLOPRAMIDE HCL 5 MG/ML IJ SOLN
10.0000 mg | Freq: Once | INTRAMUSCULAR | Status: AC
  Administered 2024-04-19: 10 mg via INTRAVENOUS
  Filled 2024-04-19: qty 2

## 2024-04-19 MED ORDER — CLOPIDOGREL BISULFATE 75 MG PO TABS
75.0000 mg | ORAL_TABLET | Freq: Every day | ORAL | Status: DC
  Administered 2024-04-19: 75 mg via ORAL
  Filled 2024-04-19: qty 1

## 2024-04-19 MED ORDER — SODIUM CHLORIDE 0.9 % IV SOLN: INTRAVENOUS | Status: DC

## 2024-04-19 MED ORDER — BUTALBITAL-APAP-CAFFEINE 50-325-40 MG PO TABS: 1.0000 | ORAL_TABLET | ORAL | 0 refills | Status: DC | PRN

## 2024-04-19 MED ORDER — ONDANSETRON HCL 4 MG/2ML IJ SOLN
4.0000 mg | Freq: Once | INTRAMUSCULAR | Status: AC
  Administered 2024-04-19: 4 mg via INTRAVENOUS
  Filled 2024-04-19: qty 2

## 2024-04-19 MED ORDER — BUTALBITAL-APAP-CAFFEINE 50-325-40 MG PO TABS
1.0000 | ORAL_TABLET | ORAL | Status: DC | PRN
  Administered 2024-04-19: 1 via ORAL
  Filled 2024-04-19: qty 1

## 2024-04-19 MED ORDER — GADOBUTROL 1 MMOL/ML IV SOLN
7.5000 mL | Freq: Once | INTRAVENOUS | Status: AC | PRN
  Administered 2024-04-19: 7.5 mL via INTRAVENOUS

## 2024-04-19 MED ORDER — ALUM & MAG HYDROXIDE-SIMETH 200-200-20 MG/5ML PO SUSP
15.0000 mL | Freq: Four times a day (QID) | ORAL | Status: DC | PRN
  Administered 2024-04-19: 15 mL via ORAL
  Filled 2024-04-19: qty 30

## 2024-04-19 MED ORDER — ONDANSETRON HCL 4 MG/2ML IJ SOLN
4.0000 mg | Freq: Four times a day (QID) | INTRAMUSCULAR | Status: DC | PRN
  Administered 2024-04-19: 4 mg via INTRAVENOUS
  Filled 2024-04-19: qty 2

## 2024-04-19 MED ORDER — STROKE: EARLY STAGES OF RECOVERY BOOK: Freq: Once | Status: DC

## 2024-04-19 MED ORDER — BUTALBITAL-APAP-CAFFEINE 50-325-40 MG PO TABS
1.0000 | ORAL_TABLET | ORAL | 0 refills | Status: AC | PRN
  Filled 2024-04-19: qty 14, 3d supply, fill #0

## 2024-04-19 MED ORDER — ASPIRIN 325 MG PO TABS
325.0000 mg | ORAL_TABLET | Freq: Once | ORAL | Status: AC
  Administered 2024-04-19: 325 mg via ORAL
  Filled 2024-04-19: qty 1

## 2024-04-19 MED ORDER — ACETAMINOPHEN 160 MG/5ML PO SOLN: 650.0000 mg | ORAL | Status: DC | PRN

## 2024-04-19 MED ORDER — ACETAMINOPHEN 325 MG PO TABS: 650.0000 mg | ORAL_TABLET | ORAL | Status: DC | PRN

## 2024-04-19 NOTE — Assessment & Plan Note (Signed)
 No longer on AEDs

## 2024-04-19 NOTE — Consult Note (Addendum)
 Advanced Imaging:  CTA Head and Neck Completed.  LVO:No  [facility] Tele[facility]urology Consult Services   Patient Name:   Daniel Contreras, Daniel Contreras Date of Birth:   [birth year] Identification Number:   MRN - [id number] Date of Service:   04/19/2024 01:17:32  Diagnosis:       H53.8 - Blurred Vision       R51.9 - Headache, unspecified  Impression:      Patient is a 39 year old gentleman with no significant past medical history who presents for evaluation of headache and vision problems in the right eye.    Neurology consulted for stroke alert.    CT head does not reveal acute abnormalities.  No thrombolytic therapy - OOW  CTA head and neck are pending. If positive for large vessel occlusion recommend endovascular consult.  If CTA head and neck are negative for large vessel occlusion, recommend MRI brain.  If CTA can not be completed, recommended MRA H&N    Our recommendations are outlined below.  Recommendations:        Stroke/Telemetry Floor       Neuro Checks (Q4)       Bedside Swallow Eval       DVT Prophylaxis       IV Fluids, Normal Saline       Head of Bed 30 Degrees       Euglycemia and Avoid Hyperthermia (PRN Acetaminophen )       Initiate or continue Aspirin  81 MG daily       Antihypertensives PRN if Blood pressure is greater than 220/120 or there is a concern for End organ damage/contraindications for permissive HTN. If blood pressure is greater than 220/120 give labetalol PO or IV or Vasotec IV with a goal of 15% reduction in BP during the first 24 hours.    ------------------------------------------------------------------------------  Advanced Imaging: Advanced imaging has been ordered. Results pending.   Metrics: Last Known Well: 04/18/2024 14:00:01 Arrival Time: 04/19/2024 00:30:01 Activation Time: 04/19/2024 01:17:32 Initial Response Time: 04/19/2024 01:19:30 Symptoms: black vision, headache. Initial patient interaction: 04/19/2024  01:26:08 NIHSS Assessment Completed: 04/19/2024 01:42:46 Patient is not a candidate for Thrombolytic. Thrombolytic Medical Decision: 04/19/2024 01:42:55 Patient was not deemed candidate for Thrombolytic because of following reasons: LKW outside 4.5 hr window. .  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  Patient was brought by private transportation with symptoms of black vision, headache. Patient is a 39 year old gentleman with no significant past medical history who presents for evaluation of headache and vision problems in the right eye. Symptoms started this afternoon around 1400. He also reports feeling dizzy and weak. He presents to the hospital for further evaluation and a stroke alert is activated.    Past Medical History:      There is no history of Hypertension      There is no history of Diabetes Mellitus  Medications:  No Anticoagulant use  No Antiplatelet use Reviewed EMR for current medications  Allergies:  Reviewed  Social History: Drug Use: No  Family History:  There is no family history of premature cerebrovascular disease pertinent to this consultation  ROS : 14 Points Review of Systems was performed and was negative except mentioned in HPI.  Past Surgical History: There Is No Surgical  History Contributory To Todays Visit     Examination: BP(141/86), Pulse(73), 1A: Level of Consciousness - Arouses to minor stimulation + 1 1B: Ask Month and Age - Both Questions Right + 0 1C: Blink Eyes & Squeeze Hands - Performs Both Tasks + 0 2: Test Horizontal Extraocular Movements - Normal + 0 3: Test Visual Fields - No Visual Loss + 0 4:  Test Facial Palsy (Use Grimace if Obtunded) - Normal symmetry + 0 5A: Test Left Arm Motor Drift - No Drift for 10 Seconds + 0 5B: Test Right Arm Motor Drift - Drift, hits bed + 2 6A: Test Left Leg Motor Drift - No Drift for 5 Seconds + 0 6B: Test Right Leg Motor Drift - Drift, hits bed + 2 7: Test Limb Ataxia (FNF/Heel-Shin) - No Ataxia + 0 8: Test Sensation - Mild-Moderate Loss: Less Sharp/More Dull + 1 9: Test Language/Aphasia - Normal; No aphasia + 0 10: Test Dysarthria - Normal + 0 11: Test Extinction/Inattention - No abnormality + 0  NIHSS Score: 6   Pre-Morbid Modified Rankin Scale: 0 Points = No symptoms at all  Spoke with : Dr. Waymond I reviewed the available imaging via [facility] and initiated discussion with the primary provider  This consult was conducted in real time using interactive audio and video technology. Patient was informed of the technology being used for this visit and agreed to proceed. Patient located in hospital and provider located at home/office setting.   Patient is being evaluated for possible acute neurologic impairment and high probability of imminent or life-threatening deterioration. I spent total of 45 minutes providing care to this patient, including time for face to face visit via telemedicine, review of medical records, imaging studies and discussion of findings with providers, the patient and/or family.    Dr Marzetta Lemmings   [facility] For Inpatient follow-up with [facility] physician please call [facility] at [phone]. As we are not an outpatient service for any post hospital discharge needs please contact the hospital for assistance. If you have any questions for the [facility] physicians or need to reconsult for clinical or diagnostic changes please contact us  via [facility] at [phone].  Non-radiologist review of imaging performed to assist with emergent clinical decision-making. Remote physician workstations do not possess the same  resolution, calibration, or diagnostic capabilities as hospital-based radiology reading stations, and formal radiologist read is necessary.   Signature : Marzetta Lemmings

## 2024-04-19 NOTE — ED Notes (Signed)
 Pt to MRI

## 2024-04-19 NOTE — ED Triage Notes (Addendum)
 Pt to ED via EMS from home, pt reports black spotty vision in right eye hat began this afternoon and headache. Pt reports he began to feel dizzy and weak this afternoon. And numbness in his throat. Pt denies weakness/numbness in extremities. Pt reports he woke up feeling this way but will not give specific time. Pt reports LKW 1400 today but keeps saying I still didn't feel normal before that pt is not on blood thinner

## 2024-04-19 NOTE — H&P (Signed)
 " History and Physical    Patient: Daniel Contreras FMW:[id number] DOB: [birth year] DOA: 04/19/2024 DOS: the patient was seen and examined on 04/19/2024 PCP: Pcp, No  Patient coming from: Home  Chief Complaint:  Chief Complaint  Patient presents with   Headache   Visual Field Change    HPI: Daniel Contreras is a 39 y.o. male with medical history significant for Seizure disorder no longer on AEDs being admitted for stroke workup after presenting with sudden onset headache associated with visual disturbance and right-sided numbness and weakness.  Stroke alert was activated upon his arrival Vitals were within normal limits on arrivalCT head was negative and CT angio head and neck showed no LVO or hemodynamically significant stenosis.. Patient was seen in consultation by teleneurology at which time NIHSS was 6.  tPA was not considered due to being outside tPA window.  Recommendations for admission for stroke workup. Other workup in the ED including labs were unremarkable. MRI ordered and pending  Admission requested      Past Medical History:  Diagnosis Date   Seizures (HCC)    History reviewed. No pertinent surgical history. Social History:  reports that he has never smoked. He has never used smokeless tobacco. He reports that he does not drink alcohol and does not use drugs.  Allergies[1]  Family History  Problem Relation Age of Onset   Seizures Neg Hx     Prior to Admission medications  Medication Sig Start Date End Date Taking? Authorizing Provider  albuterol  (VENTOLIN  HFA) 108 (90 Base) MCG/ACT inhaler Inhale 2 puffs into the lungs every 6 (six) hours as needed for wheezing or shortness of breath. 12/08/19   Woods, Jaclyn M, PA-C  dicyclomine  (BENTYL ) 10 MG capsule Take 1 capsule (10 mg total) by mouth 4 (four) times daily -  before meals and at bedtime for 20 days. 12/16/22 01/05/23  Menshew, Candida LULLA Kings, PA-C  levETIRAcetam  (KEPPRA ) 500 MG tablet Take 1 tablet (500 mg  total) by mouth 2 (two) times daily. 02/15/19   Gherghe, Costin M, MD  levETIRAcetam  (KEPPRA ) 500 MG tablet Take 1 tablet (500 mg total) by mouth 2 (two) times daily. 12/18/19 02/16/20  Delores Raford SAILOR, MD  ondansetron  (ZOFRAN -ODT) 8 MG disintegrating tablet Take 1 tablet (8 mg total) by mouth every 8 (eight) hours as needed for nausea or vomiting. 07/29/22   Bradler, Evan K, MD  pantoprazole  (PROTONIX ) 40 MG tablet Take 1 tablet (40 mg total) by mouth daily. 08/17/21 08/17/22  Edelmiro Leash, MD    Physical Exam: Vitals:   04/19/24 0035 04/19/24 0041 04/19/24 0200 04/19/24 0230  BP: (!) 141/86  128/80 (!) 140/94  Pulse: 73  81 85  Resp: 20     Temp: 98.2 F (36.8 C)     SpO2: 100%  100% 98%  Weight:  86.6 kg     Physical Exam Vitals and nursing note reviewed.  Constitutional:      General: He is not in acute distress. HENT:     Head: Normocephalic and atraumatic.  Cardiovascular:     Rate and Rhythm: Normal rate and regular rhythm.     Heart sounds: Normal heart sounds.  Pulmonary:     Effort: Pulmonary effort is normal.     Breath sounds: Normal breath sounds.  Abdominal:     Palpations: Abdomen is soft.     Tenderness: There is no abdominal tenderness.  Neurological:     Mental Status: Mental status is at baseline.  Labs on Admission: I have personally reviewed following labs and imaging studies  CBC: Recent Labs  Lab 04/19/24 0042  WBC 9.2  NEUTROABS 4.3  HGB 16.2  HCT 47.6  MCV 89.8  PLT 220   Basic Metabolic Panel: Recent Labs  Lab 04/19/24 0042  NA 135  K 3.5  CL 100  CO2 22  GLUCOSE 141*  BUN 23*  CREATININE 1.02  CALCIUM 8.9   GFR: CrCl cannot be calculated (Unknown ideal weight.). Liver Function Tests: Recent Labs  Lab 04/19/24 0042  AST 39  ALT 91*  ALKPHOS 89  BILITOT 0.3  PROT 7.3  ALBUMIN 4.5   No results for input(s): LIPASE, AMYLASE in the last 168 hours. No results for input(s): AMMONIA in the last 168  hours. Coagulation Profile: Recent Labs  Lab 04/19/24 0042  INR 1.0   Cardiac Enzymes: No results for input(s): CKTOTAL, CKMB, CKMBINDEX, TROPONINI in the last 168 hours. BNP (last 3 results) No results for input(s): PROBNP in the last 8760 hours. HbA1C: No results for input(s): HGBA1C in the last 72 hours. CBG: No results for input(s): GLUCAP in the last 168 hours. Lipid Profile: No results for input(s): CHOL, HDL, LDLCALC, TRIG, CHOLHDL, LDLDIRECT in the last 72 hours. Thyroid Function Tests: No results for input(s): TSH, T4TOTAL, FREET4, T3FREE, THYROIDAB in the last 72 hours. Anemia Panel: No results for input(s): VITAMINB12, FOLATE, FERRITIN, TIBC, IRON, RETICCTPCT in the last 72 hours. Urine analysis:    Component Value Date/Time   COLORURINE YELLOW (A) 03/14/2023 0651   APPEARANCEUR CLEAR (A) 03/14/2023 0651   APPEARANCEUR Clear 12/15/2017 1321   LABSPEC 1.028 03/14/2023 0651   LABSPEC 1.026 03/25/2013 2207   PHURINE 5.0 03/14/2023 0651   GLUCOSEU NEGATIVE 03/14/2023 0651   GLUCOSEU Negative 03/25/2013 2207   HGBUR NEGATIVE 03/14/2023 0651   BILIRUBINUR NEGATIVE 03/14/2023 0651   BILIRUBINUR Negative 12/15/2017 1321   BILIRUBINUR Negative 03/25/2013 2207   KETONESUR NEGATIVE 03/14/2023 0651   PROTEINUR NEGATIVE 03/14/2023 0651   NITRITE NEGATIVE 03/14/2023 0651   LEUKOCYTESUR NEGATIVE 03/14/2023 0651   LEUKOCYTESUR Negative 03/25/2013 2207    Radiological Exams on Admission: CT ANGIO HEAD NECK W WO CM W PERF (CODE STROKE) LKW > 6h Result Date: 04/19/2024 EXAM: CTA Head and Neck with Perfusion 04/19/2024 01:56:05 AM TECHNIQUE: CTA of the head and neck was performed with the administration of intravenous contrast. 3D postprocessing with multiplanar reconstructions and MIPs was performed to evaluate the vascular anatomy. Cerebral perfusion analysis using computed tomography with contrast administration, including  post-processing of parametric maps with determination of cerebral blood flow, cerebral blood volume, mean transit time and time-to-maximum. Automated exposure control, iterative reconstruction, and/or weight based adjustment of the mA/kV was utilized to reduce the radiation dose to as low as reasonably achievable. COMPARISON: None available CLINICAL HISTORY: Neuro deficit, acute, stroke suspected FINDINGS: AORTIC ARCH AND ARCH VESSELS: No dissection or arterial injury. No significant stenosis of the brachiocephalic or subclavian arteries. CERVICAL CAROTID ARTERIES: No dissection, arterial injury, or hemodynamically significant stenosis by NASCET criteria. CERVICAL VERTEBRAL ARTERIES: No dissection, arterial injury, or significant stenosis. LUNGS AND MEDIASTINUM: Unremarkable. SOFT TISSUES: No acute abnormality. BONES: No acute abnormality. ANTERIOR CIRCULATION: No significant stenosis of the internal carotid arteries. No significant stenosis of the anterior cerebral arteries. No significant stenosis of the middle cerebral arteries. No aneurysm. POSTERIOR CIRCULATION: No significant stenosis of the posterior cerebral arteries. No significant stenosis of the basilar artery. No significant stenosis of the vertebral arteries. No aneurysm. OTHER:  No dural venous sinus thrombosis on this non-dedicated study. EXAM QUALITY: Exam quality is adequate with diagnostic perfusion maps. No significant motion artifact. Appropriate arterial inflow and venous outflow curves. CORE INFARCT (CBF<30% volume): 0 mL TOTAL HYPOPERFUSION (Tmax>6s volume): 0 mL Mismatch volume: 0 mL Mismatch ratio: not applicable Location: not applicable IMPRESSION: 1. No large vessel occlusion or hemodynamically significant stenosis. 2. No evidence of core infarct or penumbra by CT brain perfusion. Electronically signed by: Glendia Molt MD 04/19/2024 02:25 AM EST RP Workstation: [id number]   CT HEAD CODE STROKE WO CONTRAST (LKW 0-4.5h, LVO 0-24h) Result  Date: 04/19/2024 EXAM: CT HEAD WITHOUT CONTRAST 04/19/2024 01:29:03 AM TECHNIQUE: CT of the head was performed without the administration of intravenous contrast. Automated exposure control, iterative reconstruction, and/or weight based adjustment of the mA/kV was utilized to reduce the radiation dose to as low as reasonably achievable. COMPARISON: CT Head June 4, 21 CLINICAL HISTORY: Neuro deficit, acute, stroke suspected FINDINGS: BRAIN AND VENTRICLES: No acute hemorrhage. No evidence of acute infarct. No hydrocephalus. No extra-axial collection. No mass effect or midline shift. ORBITS: No acute abnormality. SINUSES: No acute abnormality. SOFT TISSUES AND SKULL: No acute soft tissue abnormality. No skull fracture. Findings discussed with Dr. Tan at 1:36 AM via telephone. IMPRESSION: 1. No acute intracranial abnormality.  ASPECTS 10. Electronically signed by: Glendia Molt MD 04/19/2024 01:37 AM EST RP Workstation: [id number]   Data Reviewed for HPI: Relevant notes from primary care and specialist visits, past discharge summaries as available in EHR, including Care Everywhere. Prior diagnostic testing as pertinent to current admission diagnoses Updated medications and problem lists for reconciliation ED course, including vitals, labs, imaging, treatment and response to treatment Triage notes, nursing and pharmacy notes and ED provider's notes Notable results as noted above in HPI      Assessment and Plan: * Acute focal neurological deficit Permissive hypertension for first 24-48 hrs post stroke onset: Prn Labetalol IV or Vasotec IV If BP greater than 220/120  Statins for LDL goal less than 70 ASA 81mg  daily, Plavix  75mg  daily x 3 weeks then monotherapy thereafter Telemetry, echo, follow-up MRI Avoid dextrose containing fluids, Maintain euglycemia, euthermia Neuro checks q4 hrs x 24 hrs and then per shift Head of bed 30 degrees Physical therapy/Occupational therapy/Speech therapy if failed  dysphagia screen Consider neurology consult in the morning   History of seizure disorder No longer on AEDs    DVT prophylaxis: Lovenox   Consults: none  Advance Care Planning:   Code Status: Prior   Family Communication: Wife at bedside  Disposition Plan: Back to previous home environment  Severity of Illness: The appropriate patient status for this patient is OBSERVATION. Observation status is judged to be reasonable and necessary in order to provide the required intensity of service to ensure the patient's safety. The patient's presenting symptoms, physical exam findings, and initial radiographic and laboratory data in the context of their medical condition is felt to place them at decreased risk for further clinical deterioration. Furthermore, it is anticipated that the patient will be medically stable for discharge from the hospital within 2 midnights of admission.   Author: Delayne LULLA Solian, MD 04/19/2024 4:10 AM  For on call review [email or web address].      [1] No Known Allergies  "

## 2024-04-19 NOTE — Evaluation (Signed)
 Physical Therapy Evaluation & Discharge Patient Details Name: Daniel Contreras MRN: [id number] DOB: [birth year] Today's Date: 04/19/2024  History of Present Illness  Pt is a 39 y/o M admitted on 04/19/24 after presenting with c/o sudden onset HA with associated visual disturbance & R sided numbness & weakness. MRI negative for acute issues. PMH: seizure disorder  Clinical Impression  Pt seen for PT evaluation with pt agreeable to tx, wife present. Pt endorses decreased sensation to light touch & residual lingering RLE weakness but pt is able to perform at mod I level except close supervision<>CGA for stair negotiation. Pt reports he feels close to his baseline. PT to complete current orders at this time, please re-consult if new needs arise.        If plan is discharge home, recommend the following:     Can travel by private vehicle        Equipment Recommendations None recommended by PT  Recommendations for Other Services       Functional Status Assessment Patient has not had a recent decline in their functional status     Precautions / Restrictions Precautions Precautions: None Restrictions Weight Bearing Restrictions Per Provider Order: No      Mobility  Bed Mobility Overal bed mobility: Modified Independent                  Transfers Overall transfer level: Independent Equipment used: None Transfers: Sit to/from Stand Sit to Stand: Modified independent (Device/Increase time)                Ambulation/Gait Ambulation/Gait assistance: Modified independent (Device/Increase time) Gait Distance (Feet): 200 Feet Assistive device: None   Gait velocity: decreased     General Gait Details: decreased RUE reciprocal arm swing  Stairs Stairs: Yes Stairs assistance: Supervision, Contact guard assist Stair Management: Two rails Number of Stairs: 6    Wheelchair Mobility     Tilt Bed    Modified Rankin (Stroke Patients Only)       Balance  Overall balance assessment: Needs assistance Sitting-balance support: Feet supported, No upper extremity supported Sitting balance-Leahy Scale: Good     Standing balance support: No upper extremity supported Standing balance-Leahy Scale: Good                               Pertinent Vitals/Pain Pain Assessment Pain Assessment: No/denies pain    Home Living Family/patient expects to be discharged to:: Private residence Living Arrangements: Spouse/significant other;Children Available Help at Discharge: Family Type of Home: House Home Access: Stairs to enter Entrance Stairs-Rails: Right;Left;Can reach both Secretary/administrator of Steps: 3   Home Layout: One level        Prior Function Prior Level of Function : Independent/Modified Independent;Working/employed;Driving             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, heavy lifting     Extremity/Trunk Assessment   Upper Extremity Assessment Upper Extremity Assessment: Overall WFL for tasks assessed    Lower Extremity Assessment Lower Extremity Assessment: Generalized weakness (pt reports decreased sensation in RLE, notes ongoing residual weakness)    Cervical / Trunk Assessment Cervical / Trunk Assessment: Normal  Communication   Communication Communication: No apparent difficulties    Cognition Arousal: Alert Behavior During Therapy: [facility] for tasks assessed/performed  Following commands: Intact       Cueing Cueing Techniques: Verbal cues     General Comments      Exercises     Assessment/Plan    PT Assessment Patient does not need any further PT services  PT Problem List         PT Treatment Interventions      PT Goals (Current goals can be found in the Care Plan section)  Acute Rehab PT Goals Patient Stated Goal: none stated PT Goal Formulation: All assessment and education complete, DC  therapy Time For Goal Achievement: 05/03/24 Potential to Achieve Goals: Good    Frequency       Co-evaluation               AM-PAC PT 6 Clicks Mobility  Outcome Measure Help needed turning from your back to your side while in a flat bed without using bedrails?: None Help needed moving from lying on your back to sitting on the side of a flat bed without using bedrails?: None Help needed moving to and from a bed to a chair (including a wheelchair)?: None Help needed standing up from a chair using your arms (e.g., wheelchair or bedside chair)?: None Help needed to walk in hospital room?: None Help needed climbing 3-5 steps with a railing? : A Little 6 Click Score: 23    End of Session   Activity Tolerance: Patient tolerated treatment well Patient left: in bed;with call bell/phone within reach;with family/visitor present        Time: 1017-1032 PT Time Calculation (min) (ACUTE ONLY): 15 min   Charges:   PT Evaluation $PT Eval Low Complexity: 1 Low   PT General Charges $$ ACUTE PT VISIT: 1 Visit         Richerd Pinal, PT, DPT 04/19/24, 10:46 AM   Richerd CHRISTELLA Pinal 04/19/2024, 10:45 AM

## 2024-04-19 NOTE — Assessment & Plan Note (Signed)
 Permissive hypertension for first 24-48 hrs post stroke onset: Prn Labetalol IV or Vasotec IV If BP greater than 220/120  Statins for LDL goal less than 70 ASA 81mg  daily, Plavix  75mg  daily x 3 weeks then monotherapy thereafter Telemetry, echo, follow-up MRI Avoid dextrose containing fluids, Maintain euglycemia, euthermia Neuro checks q4 hrs x 24 hrs and then per shift Head of bed 30 degrees Physical therapy/Occupational therapy/Speech therapy if failed dysphagia screen Consider neurology consult in the morning

## 2024-04-19 NOTE — Evaluation (Signed)
 Occupational Therapy Evaluation Patient Details Name: Daniel Contreras MRN: [id number] DOB: [birth year] Today's Date: 04/19/2024   History of Present Illness   39 y.o. male with history of seizures, presenting with headache and vision changes to the right eye.     Clinical Impressions Pt was seen for OT evaluation this date. 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 Pt lives with his spouse and children. Pt presents with mild deficits in BUE grasp strength and FMC as well as mild balance deficits. Endorses mild headache and reports PRN sharp/shooting pain radiating down RUE. Denies RUE pain during evaluation and denies aggravating factors. Not overtly impacting his ADL performance. Slow with mobility requiring SBA for ~50' amb without AD. Pt/spouse report pt appears at baseline. Do not anticipate the need for follow up OT services upon acute hospital DC. Will sign off.     If plan is discharge home, recommend the following:   Assistance with cooking/housework;Assist for transportation     Functional Status Assessment   Patient has had a recent decline in their functional status and demonstrates the ability to make significant improvements in function in a reasonable and predictable amount of time.     Equipment Recommendations   None recommended by OT     Recommendations for Other Services         Precautions/Restrictions   Precautions Precautions: Fall Recall of Precautions/Restrictions: Intact Restrictions Weight Bearing Restrictions Per Provider Order: No     Mobility Bed Mobility Overal bed mobility: Modified Independent                  Transfers Overall transfer level: Needs assistance Equipment used: None Transfers: Sit to/from Stand Sit to Stand: Supervision                  Balance Overall balance assessment: Needs assistance Sitting-balance support: Feet supported, No upper  extremity supported Sitting balance-Leahy Scale: Good     Standing balance support: No upper extremity supported Standing balance-Leahy Scale: Fair                             ADL either performed or assessed with clinical judgement   ADL Overall ADL's : Modified independent                                       General ADL Comments: Pt slow but able to complete LB dressing without assist. Pt reports up to bathroom earlier without difficulty.     Vision Patient Visual Report: No change from baseline Vision Assessment?: No apparent visual deficits Additional Comments: Pt denies visual deficits at this time.     Perception         Praxis         Pertinent Vitals/Pain Pain Assessment Pain Assessment: 0-10 Pain Score: 5  Pain Location: headache Pain Descriptors / Indicators: Headache Pain Intervention(s): Monitored during session, Repositioned, Premedicated before session     Extremity/Trunk Assessment Upper Extremity Assessment Upper Extremity Assessment: Overall WFL for tasks assessed;Generalized weakness;Right hand dominant (grossly WFL, ?? effort, decr grasp, decr FMC bilaterally)   Lower Extremity Assessment Lower Extremity Assessment: Defer to PT evaluation   Cervical / Trunk Assessment Cervical / Trunk Assessment: Normal   Communication Communication Communication: No apparent difficulties   Cognition Arousal: Alert Behavior During Therapy:  WFL for tasks assessed/performed Cognition: No apparent impairments                               Following commands: Intact       Cueing  General Comments   Cueing Techniques: Verbal cues      Exercises Other Exercises Other Exercises: Pt ambulated ~50' with SBA   Shoulder Instructions      Home Living Family/patient expects to be discharged to:: Private residence Living Arrangements: Spouse/significant other;Children Available Help at Discharge: Family Type of  Home: House                                  Prior Functioning/Environment Prior Level of Function : Independent/Modified Independent;Working/employed;Driving               ADLs Comments: works in a dealer, heavy lifting    OT Problem List: Decreased coordination;Pain;Impaired balance (sitting and/or standing)   OT Treatment/Interventions:        OT Goals(Current goals can be found in the care plan section)   Acute Rehab OT Goals Patient Stated Goal: get better OT Goal Formulation: All assessment and education complete, DC therapy   OT Frequency:       Co-evaluation              AM-PAC OT 6 Clicks Daily Activity     Outcome Measure Help from another person eating meals?: None Help from another person taking care of personal grooming?: None Help from another person toileting, which includes using toliet, bedpan, or urinal?: None Help from another person bathing (including washing, rinsing, drying)?: None Help from another person to put on and taking off regular upper body clothing?: None Help from another person to put on and taking off regular lower body clothing?: None 6 Click Score: 24   End of Session    Activity Tolerance: Patient tolerated treatment well Patient left: in bed;with call bell/phone within reach;with family/visitor present  OT Visit Diagnosis: Unsteadiness on feet (R26.81)                Time: 9086-9069 OT Time Calculation (min): 17 min Charges:  OT General Charges $OT Visit: 1 Visit OT Evaluation $OT Eval Low Complexity: 1 Low  Warren SAUNDERS., MPH, MS, OTR/L ascom [phone] 04/19/24, 10:09 AM

## 2024-04-19 NOTE — Consult Note (Signed)
 Reason for Consult:Vision loss and right sided weakness Requesting Physician: Agbata  CC: Vision loss and right sided weakness  I have been asked by Dr. Lanetta to see this patient in consultation for vision loss and right sided weakness.  HPI: Daniel Contreras is an 39 y.o. male who reports a history of migraines for about the past 2 years and seizures.  Patient reports that on yesterday began to have dizziness, nausea and vomiting along with holocranial headache.  Took an OTC medication for migraines (components in Tylenol  and caffeine ).  Soon after noted loss of vision in the right eye and right sided numbness and weakness.  Presented to the ED where code stroke was initiated.  Patient not a thrombectomy or thrombolytic candidate.  Patient reports now being back to baseline.   Has headaches fairly frequently.  Did have an episode similar to this a few years ago.  Again lost vision.  Work up including MRI of the brain was unremarkable.   History of seizures that began in 2006.  Was placed on Keppra  until this was discontinued on 2022.  Has had no further seizures since that time.  Past Medical History:  Diagnosis Date   Seizures (HCC)     History reviewed. No pertinent surgical history.  Family History  Problem Relation Age of Onset   Seizures Neg Hx     Social History:  reports that he has never smoked. He has never used smokeless tobacco. He reports that he does not drink alcohol and does not use drugs.  Allergies[1]  Medications:  Prior to Admission medications  Medication Sig Start Date End Date Taking? Authorizing Provider  albuterol  (VENTOLIN  HFA) 108 (90 Base) MCG/ACT inhaler Inhale 2 puffs into the lungs every 6 (six) hours as needed for wheezing or shortness of breath. 12/08/19   Woods, Jaclyn M, PA-C  dicyclomine  (BENTYL ) 10 MG capsule Take 1 capsule (10 mg total) by mouth 4 (four) times daily -  before meals and at bedtime for 20 days. 12/16/22 01/05/23  Menshew, Candida LULLA Kings, PA-C  levETIRAcetam  (KEPPRA ) 500 MG tablet Take 1 tablet (500 mg total) by mouth 2 (two) times daily. 02/15/19   Gherghe, Costin M, MD  levETIRAcetam  (KEPPRA ) 500 MG tablet Take 1 tablet (500 mg total) by mouth 2 (two) times daily. 12/18/19 02/16/20  Delores Raford SAILOR, MD  ondansetron  (ZOFRAN -ODT) 8 MG disintegrating tablet Take 1 tablet (8 mg total) by mouth every 8 (eight) hours as needed for nausea or vomiting. 07/29/22   Bradler, Evan K, MD  pantoprazole  (PROTONIX ) 40 MG tablet Take 1 tablet (40 mg total) by mouth daily. 08/17/21 08/17/22  Edelmiro Leash, MD     ROS: History obtained from the patient  General ROS: negative for - chills, fatigue, fever, night sweats, weight gain or weight loss Psychological ROS: negative for - behavioral disorder, hallucinations, memory difficulties, mood swings or suicidal ideation Ophthalmic ROS: as noted in HPI ENT ROS: negative for - epistaxis, nasal discharge, oral lesions, sore throat, tinnitus or vertigo Allergy and Immunology ROS: negative for - hives or itchy/watery eyes Hematological and Lymphatic ROS: negative for - bleeding problems, bruising or swollen lymph nodes Endocrine ROS: negative for - galactorrhea, hair pattern changes, polydipsia/polyuria or temperature intolerance Respiratory ROS: negative for - cough, hemoptysis, shortness of breath or wheezing Cardiovascular ROS: negative for - chest pain, dyspnea on exertion, edema or irregular heartbeat Gastrointestinal ROS: negative for - abdominal pain, diarrhea, hematemesis, nausea/vomiting or stool incontinence Genito-Urinary ROS: negative for -  dysuria, hematuria, incontinence or urinary frequency/urgency Musculoskeletal ROS: negative for - joint swelling or muscular weakness Neurological ROS: as noted in HPI Dermatological ROS: negative for rash and skin lesion changes   Physical Examination: Blood pressure 121/77, pulse 64, temperature 98 F (36.7 C), temperature source Oral,  resp. rate 18, height 5' 8 (1.727 m), weight 86.6 kg, SpO2 98%.  HEENT-  Normocephalic, no lesions, without obvious abnormality.  Normal external eye and conjunctiva.  Normal external ears. Normal external nose, mucus membranes and septum. Cardiovascular- Single S1, S2 Lungs- CTA Abdomen- soft, non-tender; bowel sounds normal; no masses,  no organomegaly Extremities- no edema Musculoskeletal-no joint tenderness, deformity or swelling Skin-warm and dry, no hyperpigmentation, vitiligo, or suspicious lesions  Neurological Examination   Mental Status: Alert, oriented, thought content appropriate.  Speech fluent without evidence of aphasia.  Able to follow 3 step commands without difficulty. Cranial Nerves: II: Visual fields grossly normal, pupils equal, round, reactive to light and accommodation III,IV, VI: ptosis not present, extra-ocular motions intact bilaterally V,VII: smile symmetric, facial light touch sensation normal bilaterally VIII: hearing normal bilaterally XI: bilateral shoulder shrug XII: midline tongue extension Motor: Right : Upper extremity   5/5    Left:     Upper extremity   5/5  Lower extremity   5/5     Lower extremity   5/5 Tone and bulk:normal tone throughout; no atrophy noted Sensory: Pinprick and light touch decreased in the RLE Deep Tendon Reflexes: 2+ and symmetric throughout Plantars: Right: downgoing   Left: downgoing Cerebellar: normal finger-to-nose and normal heel-to-shin testing bilaterally Gait: not tested due to safety concerns     Laboratory Studies:   Basic Metabolic Panel: Recent Labs  Lab 04/19/24 0042  NA 135  K 3.5  CL 100  CO2 22  GLUCOSE 141*  BUN 23*  CREATININE 1.02  CALCIUM 8.9    Liver Function Tests: Recent Labs  Lab 04/19/24 0042  AST 39  ALT 91*  ALKPHOS 89  BILITOT 0.3  PROT 7.3  ALBUMIN 4.5   No results for input(s): LIPASE, AMYLASE in the last 168 hours. No results for input(s): AMMONIA in the last 168  hours.  CBC: Recent Labs  Lab 04/19/24 0042  WBC 9.2  NEUTROABS 4.3  HGB 16.2  HCT 47.6  MCV 89.8  PLT 220    Cardiac Enzymes: No results for input(s): CKTOTAL, CKMB, CKMBINDEX, TROPONINI in the last 168 hours.  BNP: Invalid input(s): POCBNP  CBG: No results for input(s): GLUCAP in the last 168 hours.  Microbiology: Results for orders placed or performed during the hospital encounter of 12/16/22  Gastrointestinal Panel by PCR , Stool     Status: None   Collection Time: 12/16/22  9:47 PM   Specimen: Stool  Result Value Ref Range Status   Campylobacter species NOT DETECTED NOT DETECTED Final   Plesimonas shigelloides NOT DETECTED NOT DETECTED Final   Salmonella species NOT DETECTED NOT DETECTED Final   Yersinia enterocolitica NOT DETECTED NOT DETECTED Final   Vibrio species NOT DETECTED NOT DETECTED Final   Vibrio cholerae NOT DETECTED NOT DETECTED Final   Enteroaggregative E coli (EAEC) NOT DETECTED NOT DETECTED Final   Enteropathogenic E coli (EPEC) NOT DETECTED NOT DETECTED Final   Enterotoxigenic E coli (ETEC) NOT DETECTED NOT DETECTED Final   Shiga like toxin producing E coli (STEC) NOT DETECTED NOT DETECTED Final   Shigella/Enteroinvasive E coli (EIEC) NOT DETECTED NOT DETECTED Final   Cryptosporidium NOT DETECTED NOT DETECTED Final  Cyclospora cayetanensis NOT DETECTED NOT DETECTED Final   Entamoeba histolytica NOT DETECTED NOT DETECTED Final   Giardia lamblia NOT DETECTED NOT DETECTED Final   Adenovirus F40/41 NOT DETECTED NOT DETECTED Final   Astrovirus NOT DETECTED NOT DETECTED Final   Norovirus GI/GII NOT DETECTED NOT DETECTED Final   Rotavirus A NOT DETECTED NOT DETECTED Final   Sapovirus (I, II, IV, and V) NOT DETECTED NOT DETECTED Final    Comment: Performed at [facility], [address], [place], [place] [address]  C Difficile Quick Screen w PCR reflex     Status: None   Collection Time: 12/16/22  9:47 PM   Specimen: Stool   Result Value Ref Range Status   C Diff antigen NEGATIVE NEGATIVE Final   C Diff toxin NEGATIVE NEGATIVE Final   C Diff interpretation No C. difficile detected.  Final    Comment: Performed at [facility], [address] [place], [place] [address]    Coagulation Studies: Recent Labs    04/19/24 0042  LABPROT 13.9  INR 1.0    Urinalysis: No results for input(s): COLORURINE, LABSPEC, PHURINE, GLUCOSEU, HGBUR, BILIRUBINUR, KETONESUR, PROTEINUR, UROBILINOGEN, NITRITE, LEUKOCYTESUR in the last 168 hours.  Invalid input(s): APPERANCEUR  Lipid Panel:     Component Value Date/Time   CHOL 201 (H) 04/19/2024 0516   TRIG 65 04/19/2024 0516   HDL 38 (L) 04/19/2024 0516   CHOLHDL 5.3 04/19/2024 0516   VLDL 13 04/19/2024 0516   LDLCALC 150 (H) 04/19/2024 0516    HgbA1C:  Lab Results  Component Value Date   HGBA1C 5.7 (H) 04/19/2024    Urine Drug Screen:      Component Value Date/Time   LABOPIA NEGATIVE 04/19/2024 0334   COCAINSCRNUR NEGATIVE 04/19/2024 0334   COCAINSCRNUR NONE DETECTED 02/14/2019 1934   LABBENZ NEGATIVE 04/19/2024 0334   AMPHETMU NEGATIVE 04/19/2024 0334   THCU NEGATIVE 04/19/2024 0334   LABBARB NEGATIVE 04/19/2024 0334    Alcohol Level: No results for input(s): ETH in the last 168 hours.  Imaging: MR CERVICAL SPINE W WO CONTRAST Result Date: 04/19/2024 EXAM: MRI CERVICAL SPINE WITH AND WITHOUT CONTRAST 04/19/2024 11:12:35 AM TECHNIQUE: Multiplanar multisequence MRI of the cervical spine was performed without and with the administration of intravenous contrast. 7.5 mL of Gadavist . COMPARISON: Brain MRI and CTA head and neck earlier today. CLINICAL HISTORY: 39 year old male with acute onset right eye vision changes, headache, dizziness, and weakness. FINDINGS: BONES AND ALIGNMENT: Normal cervical lordosis. Normal vertebral body heights. Background marrow signal is normal. No marrow edema. No abnormal enhancement. SPINAL CORD:  Normal spinal cord size. Normal spinal cord signal. No abnormal intradural enhancement. No dural thickening. SOFT TISSUES: No paraspinal mass. Negative visible neck soft tissues. Preserved visible major vascular flow voids in the neck. Negative cervicomedullary junction. 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 DEGENERATIVE: In general there is no age advanced cervical spine degeneration. No cervical spinal stenosis. C3-C4 left foraminal disc bulge or disc osteophyte complex (series 5 image 10 and series 8 image 18). Moderate left C4 neural foraminal stenosis. No other cervical foraminal stenosis. IMPRESSION: 1. Minimal cervical spine degeneration. Leftward disc bulging or small protrusion at C3-C4 with up to moderate left C4 nerve level foraminal stenosis. 2. No cervical spinal stenosis.  Normal cervical spinal cord. Electronically signed by: Helayne Hurst MD 04/19/2024 01:10 PM EST RP Workstation: [id number]   MR BRAIN WO CONTRAST Result Date: 04/19/2024 EXAM: MRI BRAIN WITHOUT CONTRAST 04/19/2024 04:16:22 AM  TECHNIQUE: Multiplanar multisequence MRI of the head/brain was performed without the administration of intravenous contrast. COMPARISON: Head CT and CTA head and neck reported earlier today. Brain MRI 08/18/2015. CLINICAL HISTORY: 39 year old male with acute onset vision changes in the right eye, headache, dizziness, and weakness. Neuro deficit, acute, stroke suspected. FINDINGS: BRAIN AND VENTRICLES: No acute infarct. No intracranial hemorrhage. No midline shift. No hydrocephalus. The sella is unremarkable. Normal flow voids. 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 No posterior fossa mass effect. Normal background brain volume. Elnor and white matter signal remains normal for age. No encephalomalacia or chronic cerebral blood products identified. ORBITS: Normal  suprasellar cistern. Non-contrast optic chiasm and cavernous sinus appear unremarkable. 4 bit soft tissues appear stable and negative. SINUSES AND MASTOIDS: No significant abnormality. Mastoids and visible internal auditory structures appear negative with normal stylomastoid foramina. BONES AND SOFT TISSUES: Normal marrow signal. No soft tissue abnormality. Negative visible cervical spine and spinal cord. IMPRESSION: 1. No acute intracranial abnormality. Stable since 2017 and negative non-contrast brain MRI. Electronically signed by: Helayne Hurst MD 04/19/2024 04:29 AM EST RP Workstation: [id number]   CT ANGIO HEAD NECK W WO CM W PERF (CODE STROKE) LKW > 6h Result Date: 04/19/2024 EXAM: CTA Head and Neck with Perfusion 04/19/2024 01:56:05 AM TECHNIQUE: CTA of the head and neck was performed with the administration of intravenous contrast. 3D postprocessing with multiplanar reconstructions and MIPs was performed to evaluate the vascular anatomy. Cerebral perfusion analysis using computed tomography with contrast administration, including post-processing of parametric maps with determination of cerebral blood flow, cerebral blood volume, mean transit time and time-to-maximum. Automated exposure control, iterative reconstruction, and/or weight based adjustment of the mA/kV was utilized to reduce the radiation dose to as low as reasonably achievable. COMPARISON: None available CLINICAL HISTORY: Neuro deficit, acute, stroke suspected FINDINGS: AORTIC ARCH AND ARCH VESSELS: No dissection or arterial injury. No significant stenosis of the brachiocephalic or subclavian arteries. CERVICAL CAROTID ARTERIES: No dissection, arterial injury, or hemodynamically significant stenosis by NASCET criteria. CERVICAL VERTEBRAL ARTERIES: No dissection, arterial injury, or significant stenosis. LUNGS AND MEDIASTINUM: Unremarkable. SOFT TISSUES: No acute abnormality. BONES: No acute abnormality. ANTERIOR CIRCULATION: No significant  stenosis of the internal carotid arteries. No significant stenosis of the anterior cerebral arteries. No significant stenosis of the middle cerebral arteries. No aneurysm. POSTERIOR CIRCULATION: No significant stenosis of the posterior cerebral arteries. No significant stenosis of the basilar artery. No significant stenosis of the vertebral arteries. No aneurysm. OTHER: No dural venous sinus thrombosis on this non-dedicated study. EXAM QUALITY: Exam quality is adequate with diagnostic perfusion maps. No significant motion artifact. Appropriate arterial inflow and venous outflow curves. CORE INFARCT (CBF<30% volume): 0 mL TOTAL HYPOPERFUSION (Tmax>6s volume): 0 mL Mismatch volume: 0 mL Mismatch ratio: not applicable Location: not applicable IMPRESSION: 1. No large vessel occlusion or hemodynamically significant stenosis. 2. No evidence of core infarct or penumbra by CT brain perfusion. Electronically signed by: Glendia Molt MD 04/19/2024 02:25 AM EST RP Workstation: [id number]   CT HEAD CODE STROKE WO CONTRAST (LKW 0-4.5h, LVO 0-24h) Result Date: 04/19/2024 EXAM: CT HEAD WITHOUT CONTRAST 04/19/2024 01:29:03 AM TECHNIQUE: CT of the head was performed without the administration of intravenous contrast. Automated exposure control, iterative reconstruction, and/or weight based adjustment of the mA/kV was utilized to reduce the radiation dose to as low as reasonably achievable. COMPARISON: CT Head June 4, 21 CLINICAL HISTORY: Neuro deficit, acute, stroke suspected  FINDINGS: BRAIN AND VENTRICLES: No acute hemorrhage. No evidence of acute infarct. No hydrocephalus. No extra-axial collection. No mass effect or midline shift. ORBITS: No acute abnormality. SINUSES: No acute abnormality. SOFT TISSUES AND SKULL: No acute soft tissue abnormality. No skull fracture. Findings discussed with Dr. Tan at 1:36 AM via telephone. IMPRESSION: 1. No acute intracranial abnormality.  ASPECTS 10. Electronically signed by: Glendia Molt MD  04/19/2024 01:37 AM EST RP Workstation: [id number]     Assessment/Plan: 39 y.o. male who reports a history of migraines for about the past 2 years and seizures.  Patient reports that on yesterday began to have dizziness, nausea and vomiting along with holocranial headache.  Took an OTC medication for migraines (components in Tylenol  and caffeine ).  Soon after noted loss of vision in the right eye and right sided numbness and weakness.  Presented to the ED where code stroke was initiated.  Patient not a thrombectomy or thrombolytic candidate.  Patient reports now being back to baseline.   Has headaches fairly frequently.  Did have an episode similar to this a few years ago.  Again lost vision.  Work up including MRI of the brain was unremarkable.   Head CT personally reviewed and shows no acute changes.  CTA of the head and neck shows no evidence of LVO.  MRI of the brain personally reviewed and shows no evidence of acute infarct.  Etiology of presentation likely complicated migraine.  Although less likely would rule out temporal arteritis as well.    Recommendations: ESR, CRP Patient to follow up with neurology or PCP on an outpatient basis for initiation of migraine prophylaxis.    Case discussed with Dr. Lanetta Sonny Hock, MD Neurology  04/19/2024, 1:42 PM          [1] No Known Allergies

## 2024-04-19 NOTE — Discharge Summary (Signed)
 " Physician Discharge Summary   Patient: Daniel Contreras MRN: [id number] DOB: [birth year]  Admit date:     04/19/2024  Discharge date: 04/19/24  Discharge Physician: Nathaniel Wakeley   PCP: Pcp, No   Recommendations at discharge:   Follow-up with neurology as an outpatient Return to the ER for evaluation of worsening symptoms  Discharge Diagnoses: Principal Problem:   Complicated migraine Active Problems:   Acute focal neurological deficit   History of seizure disorder  Resolved Problems:   * No resolved hospital problems. *  Hospital Course: Pearse Shiffler is a 39 y.o. male with medical history significant for Seizure disorder no longer on AEDs being admitted for stroke workup after presenting with sudden onset headache associated with visual disturbance and right-sided numbness and weakness.  Stroke alert was activated upon his arrival Vitals were within normal limits on arrivalCT head was negative and CT angio head and neck showed no LVO or hemodynamically significant stenosis.. Patient was seen in consultation by teleneurology at which time NIHSS was 6.  tPA was not considered due to being outside tPA window.  Recommendations for admission for stroke workup. Other workup in the ED including labs were unremarkable. MRI ordered and pending   Admission requested   Assessment and Plan:  Complicated migraine Acute focal neurological deficit (Resolved) Patient with a history of migraine headaches who presented to the ER for evaluation of sudden onset dizziness, nausea, vomiting and a holocranial headache. He also had visual loss in the right eye, right sided numbness and weakness which has since resolved Has had a similar episode in the past Patient not a candidate for thrombectomy or thrombolytics due to resolution of his symptoms Stroke workup was within normal limits and MRI of the brain showed no evidence of an acute infarct.  CT angiogram was negative for  LVO Appreciate neurology input.  Recommends outpatient neurology follow-up   History of seizures Has been off antiepileptic therapy for several years No recent seizures       Consultants: Neurology Procedures performed: MRI of the brain/cervical spine, CT scan of the head and neck Disposition: Home Diet recommendation:  Discharge Diet Orders (From admission, onward)     Start     Ordered   04/19/24 0000  Diet general        04/19/24 1354           Regular diet DISCHARGE MEDICATION: Allergies as of 04/19/2024   No Known Allergies      Medication List     STOP taking these medications    albuterol  108 (90 Base) MCG/ACT inhaler Commonly known as: VENTOLIN  HFA   dicyclomine  10 MG capsule Commonly known as: BENTYL    levETIRAcetam  500 MG tablet Commonly known as: Keppra    ondansetron  8 MG disintegrating tablet Commonly known as: ZOFRAN -ODT       TAKE these medications    butalbital -acetaminophen -caffeine  50-325-40 MG tablet Commonly known as: FIORICET  Take 1 tablet by mouth every 4 (four) hours as needed for headache or migraine.   pantoprazole  40 MG tablet Commonly known as: Protonix  Take 1 tablet (40 mg total) by mouth daily.        Follow-up Information     Maree Jannett POUR, MD Follow up in 1 week(s).   Specialty: Neurology Contact information: [phone] [facility] [address] [facility] [place] [address] [phone]                Discharge Exam: Filed Weights   04/19/24 0041 04/19/24 9472  Weight: 86.6 kg 86.6 kg   Constitutional:      General: He is not in acute distress. HENT:     Head: Normocephalic and atraumatic.  Cardiovascular:     Rate and Rhythm: Normal rate and regular rhythm.     Heart sounds: Normal heart sounds.  Pulmonary:     Effort: Pulmonary effort is normal.     Breath sounds: Normal breath sounds.  Abdominal:     Palpations: Abdomen is soft.     Tenderness: There is no abdominal  tenderness.  Neurological:     Mental Status: Mental status is at baseline.     Condition at discharge: stable  The results of significant diagnostics from this hospitalization (including imaging, microbiology, ancillary and laboratory) are listed below for reference.   Imaging Studies: ECHOCARDIOGRAM COMPLETE Result Date: 04/19/2024    ECHOCARDIOGRAM REPORT   Patient Name:   Daniel Contreras Date of Exam: 04/19/2024 Medical Rec #:  [id number]           Height:       68.1 in Accession #:    [id number]          Weight:       190.9 lb Date of Birth:  1985/09/26           BSA:          2.006 m Patient Age:    38 years            BP:           121/77 mmHg Patient Gender: M                   HR:           64 bpm. Exam Location:  [facility] Procedure: 2D Echo, 3D Echo, Strain Analysis and Saline Contrast Bubble Study            (Both Spectral and Color Flow Doppler were utilized during            procedure). Indications:     Stroke I63.9  History:         Patient has no prior history of Echocardiogram examinations.                  Seizures.  Sonographer:     Christopher Furnace Referring Phys:  [id number] DELAYNE LULLA SOLIAN Diagnosing Phys: Cara JONETTA Lovelace MD  Sonographer Comments: Global longitudinal strain was attempted. IMPRESSIONS  1. Left ventricular ejection fraction, by estimation, is 60 to 65%. The left ventricle has normal function. The left ventricle has no regional wall motion abnormalities. The left ventricular internal cavity size was mildly to moderately dilated. Left ventricular diastolic parameters are consistent with Grade II diastolic dysfunction (pseudonormalization). The average left ventricular global longitudinal strain is 15.3 %. The global longitudinal strain is abnormal.  2. Right ventricular systolic function is normal. The right ventricular size is normal.  3. The mitral valve is grossly normal. Trivial mitral valve regurgitation.  4. The aortic valve is normal in structure. Aortic valve regurgitation  is not visualized. FINDINGS  Left Ventricle: Left ventricular ejection fraction, by estimation, is 60 to 65%. The left ventricle has normal function. The left ventricle has no regional wall motion abnormalities. The average left ventricular global longitudinal strain is 15.3 %. Strain was performed and the global longitudinal strain is abnormal. The left ventricular internal cavity size was mildly to moderately dilated. There is borderline left ventricular hypertrophy. Left ventricular diastolic parameters  are consistent with Grade II diastolic dysfunction (pseudonormalization). Right Ventricle: The right ventricular size is normal. No increase in right ventricular wall thickness. Right ventricular systolic function is normal. Left Atrium: Left atrial size was normal in size. Right Atrium: Right atrial size was normal in size. Pericardium: There is no evidence of pericardial effusion. Mitral Valve: The mitral valve is grossly normal. Trivial mitral valve regurgitation. MV peak gradient, 2.2 mmHg. The mean mitral valve gradient is 1.0 mmHg. Tricuspid Valve: The tricuspid valve is grossly normal. Tricuspid valve regurgitation is trivial. Aortic Valve: The aortic valve is normal in structure. Aortic valve regurgitation is not visualized. Aortic valve mean gradient measures 4.0 mmHg. Aortic valve peak gradient measures 6.2 mmHg. Aortic valve area, by VTI measures 2.11 cm. Pulmonic Valve: The pulmonic valve was not well visualized. Pulmonic valve regurgitation is not visualized. Aorta: The ascending aorta was not well visualized. IAS/Shunts: No atrial level shunt detected by color flow Doppler. Agitated saline contrast was given intravenously to evaluate for intracardiac shunting. Additional Comments: 3D was performed not requiring image post processing on an independent workstation and was normal.  LEFT VENTRICLE PLAX 2D LVIDd:         5.20 cm   Diastology LVIDs:         3.50 cm   LV e' medial:    8.05 cm/s LV PW:          1.10 cm   LV E/e' medial:  8.9 LV IVS:        1.00 cm   LV e' lateral:   10.70 cm/s LVOT diam:     2.00 cm   LV E/e' lateral: 6.7 LV SV:         58 LV SV Index:   29        2D Longitudinal Strain LVOT Area:     3.14 cm  2D Strain GLS (A4C):   15.8 % LV IVRT:       108 msec  2D Strain GLS (A3C):   11.9 %                          2D Strain GLS (A2C):   18.3 %                          2D Strain GLS Avg:     15.3 %                           3D Volume EF:                          3D EF:        55 %                          LV EDV:       196 ml                          LV ESV:       89 ml                          LV SV:        107 ml RIGHT VENTRICLE RV Basal diam:  3.30 cm RV Mid diam:  2.90 cm RV S prime:     12.00 cm/s TAPSE (M-mode): 1.8 cm LEFT ATRIUM             Index        RIGHT ATRIUM           Index LA diam:        2.90 cm 1.45 cm/m   RA Area:     14.30 cm LA Vol (A2C):   42.8 ml 21.33 ml/m  RA Volume:   36.20 ml  18.04 ml/m LA Vol (A4C):   26.7 ml 13.31 ml/m LA Biplane Vol: 34.7 ml 17.30 ml/m  AORTIC VALVE AV Area (Vmax):    2.37 cm AV Area (Vmean):   2.31 cm AV Area (VTI):     2.11 cm AV Vmax:           125.00 cm/s AV Vmean:          93.900 cm/s AV VTI:            0.277 m AV Peak Grad:      6.2 mmHg AV Mean Grad:      4.0 mmHg LVOT Vmax:         94.20 cm/s LVOT Vmean:        69.100 cm/s LVOT VTI:          0.186 m LVOT/AV VTI ratio: 0.67  AORTA Ao Root diam: 2.70 cm MITRAL VALVE               TRICUSPID VALVE MV Area (PHT): 3.77 cm    TR Peak grad:   11.6 mmHg MV Area VTI:   3.09 cm    TR Vmax:        170.00 cm/s MV Peak grad:  2.2 mmHg MV Mean grad:  1.0 mmHg    SHUNTS MV Vmax:       0.75 m/s    Systemic VTI:  0.19 m MV Vmean:      53.0 cm/s   Systemic Diam: 2.00 cm MV Decel Time: 201 msec MV E velocity: 71.80 cm/s MV A velocity: 36.20 cm/s MV E/A ratio:  1.98 Dwayne D Callwood MD Electronically signed by Cara JONETTA Lovelace MD Signature Date/Time: 04/19/2024/2:16:33 PM    Final    MR CERVICAL  SPINE W WO CONTRAST Result Date: 04/19/2024 EXAM: MRI CERVICAL SPINE WITH AND WITHOUT CONTRAST 04/19/2024 11:12:35 AM TECHNIQUE: Multiplanar multisequence MRI of the cervical spine was performed without and with the administration of intravenous contrast. 7.5 mL of Gadavist . COMPARISON: Brain MRI and CTA head and neck earlier today. CLINICAL HISTORY: 39 year old male with acute onset right eye vision changes, headache, dizziness, and weakness. FINDINGS: BONES AND ALIGNMENT: Normal cervical lordosis. Normal vertebral body heights. Background marrow signal is normal. No marrow edema. No abnormal enhancement. SPINAL CORD: Normal spinal cord size. Normal spinal cord signal. No abnormal intradural enhancement. No dural thickening. SOFT TISSUES: No paraspinal mass. Negative visible neck soft tissues. Preserved visible major vascular flow voids in the neck. Negative cervicomedullary junction. 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 DEGENERATIVE: In general there is no age advanced cervical spine degeneration. No cervical spinal stenosis. C3-C4 left foraminal disc bulge or disc osteophyte complex (series 5 image 10 and series 8 image 18). Moderate left C4 neural foraminal stenosis. No other cervical foraminal stenosis. IMPRESSION: 1. Minimal cervical spine degeneration. Leftward disc bulging or small protrusion at C3-C4 with up to moderate left C4 nerve level foraminal stenosis. 2. No  cervical spinal stenosis.  Normal cervical spinal cord. Electronically signed by: Helayne Hurst MD 04/19/2024 01:10 PM EST RP Workstation: [id number]   MR BRAIN WO CONTRAST Result Date: 04/19/2024 EXAM: MRI BRAIN WITHOUT CONTRAST 04/19/2024 04:16:22 AM TECHNIQUE: Multiplanar multisequence MRI of the head/brain was performed without the administration of intravenous contrast. COMPARISON: Head CT and CTA head and neck reported earlier today. Brain MRI 08/18/2015. CLINICAL HISTORY: 39 year old male with acute  onset vision changes in the right eye, headache, dizziness, and weakness. Neuro deficit, acute, stroke suspected. FINDINGS: BRAIN AND VENTRICLES: No acute infarct. No intracranial hemorrhage. No midline shift. No hydrocephalus. The sella is unremarkable. Normal flow voids. 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 No posterior fossa mass effect. Normal background brain volume. Elnor and white matter signal remains normal for age. No encephalomalacia or chronic cerebral blood products identified. ORBITS: Normal suprasellar cistern. Non-contrast optic chiasm and cavernous sinus appear unremarkable. 4 bit soft tissues appear stable and negative. SINUSES AND MASTOIDS: No significant abnormality. Mastoids and visible internal auditory structures appear negative with normal stylomastoid foramina. BONES AND SOFT TISSUES: Normal marrow signal. No soft tissue abnormality. Negative visible cervical spine and spinal cord. IMPRESSION: 1. No acute intracranial abnormality. Stable since 2017 and negative non-contrast brain MRI. Electronically signed by: Helayne Hurst MD 04/19/2024 04:29 AM EST RP Workstation: [id number]   CT ANGIO HEAD NECK W WO CM W PERF (CODE STROKE) LKW > 6h Result Date: 04/19/2024 EXAM: CTA Head and Neck with Perfusion 04/19/2024 01:56:05 AM TECHNIQUE: CTA of the head and neck was performed with the administration of intravenous contrast. 3D postprocessing with multiplanar reconstructions and MIPs was performed to evaluate the vascular anatomy. Cerebral perfusion analysis using computed tomography with contrast administration, including post-processing of parametric maps with determination of cerebral blood flow, cerebral blood volume, mean transit time and time-to-maximum. Automated exposure control, iterative reconstruction, and/or weight based adjustment of the mA/kV was utilized to reduce the  radiation dose to as low as reasonably achievable. COMPARISON: None available CLINICAL HISTORY: Neuro deficit, acute, stroke suspected FINDINGS: AORTIC ARCH AND ARCH VESSELS: No dissection or arterial injury. No significant stenosis of the brachiocephalic or subclavian arteries. CERVICAL CAROTID ARTERIES: No dissection, arterial injury, or hemodynamically significant stenosis by NASCET criteria. CERVICAL VERTEBRAL ARTERIES: No dissection, arterial injury, or significant stenosis. LUNGS AND MEDIASTINUM: Unremarkable. SOFT TISSUES: No acute abnormality. BONES: No acute abnormality. ANTERIOR CIRCULATION: No significant stenosis of the internal carotid arteries. No significant stenosis of the anterior cerebral arteries. No significant stenosis of the middle cerebral arteries. No aneurysm. POSTERIOR CIRCULATION: No significant stenosis of the posterior cerebral arteries. No significant stenosis of the basilar artery. No significant stenosis of the vertebral arteries. No aneurysm. OTHER: No dural venous sinus thrombosis on this non-dedicated study. EXAM QUALITY: Exam quality is adequate with diagnostic perfusion maps. No significant motion artifact. Appropriate arterial inflow and venous outflow curves. CORE INFARCT (CBF<30% volume): 0 mL TOTAL HYPOPERFUSION (Tmax>6s volume): 0 mL Mismatch volume: 0 mL Mismatch ratio: not applicable Location: not applicable IMPRESSION: 1. No large vessel occlusion or hemodynamically significant stenosis. 2. No evidence of core infarct or penumbra by CT brain perfusion. Electronically signed by: Glendia Molt MD 04/19/2024 02:25 AM EST RP Workstation: [id number]   CT HEAD CODE STROKE WO CONTRAST (LKW 0-4.5h, LVO 0-24h) Result Date: 04/19/2024 EXAM: CT HEAD WITHOUT CONTRAST 04/19/2024 01:29:03 AM TECHNIQUE: CT of the head was performed without the administration of intravenous contrast.  Automated exposure control, iterative reconstruction, and/or weight based adjustment of the mA/kV was  utilized to reduce the radiation dose to as low as reasonably achievable. COMPARISON: CT Head June 4, 21 CLINICAL HISTORY: Neuro deficit, acute, stroke suspected FINDINGS: BRAIN AND VENTRICLES: No acute hemorrhage. No evidence of acute infarct. No hydrocephalus. No extra-axial collection. No mass effect or midline shift. ORBITS: No acute abnormality. SINUSES: No acute abnormality. SOFT TISSUES AND SKULL: No acute soft tissue abnormality. No skull fracture. Findings discussed with Dr. Tan at 1:36 AM via telephone. IMPRESSION: 1. No acute intracranial abnormality.  ASPECTS 10. Electronically signed by: Glendia Molt MD 04/19/2024 01:37 AM EST RP Workstation: [id number]    Microbiology: Results for orders placed or performed during the hospital encounter of 12/16/22  Gastrointestinal Panel by PCR , Stool     Status: None   Collection Time: 12/16/22  9:47 PM   Specimen: Stool  Result Value Ref Range Status   Campylobacter species NOT DETECTED NOT DETECTED Final   Plesimonas shigelloides NOT DETECTED NOT DETECTED Final   Salmonella species NOT DETECTED NOT DETECTED Final   Yersinia enterocolitica NOT DETECTED NOT DETECTED Final   Vibrio species NOT DETECTED NOT DETECTED Final   Vibrio cholerae NOT DETECTED NOT DETECTED Final   Enteroaggregative E coli (EAEC) NOT DETECTED NOT DETECTED Final   Enteropathogenic E coli (EPEC) NOT DETECTED NOT DETECTED Final   Enterotoxigenic E coli (ETEC) NOT DETECTED NOT DETECTED Final   Shiga like toxin producing E coli (STEC) NOT DETECTED NOT DETECTED Final   Shigella/Enteroinvasive E coli (EIEC) NOT DETECTED NOT DETECTED Final   Cryptosporidium NOT DETECTED NOT DETECTED Final   Cyclospora cayetanensis NOT DETECTED NOT DETECTED Final   Entamoeba histolytica NOT DETECTED NOT DETECTED Final   Giardia lamblia NOT DETECTED NOT DETECTED Final   Adenovirus F40/41 NOT DETECTED NOT DETECTED Final   Astrovirus NOT DETECTED NOT DETECTED Final   Norovirus GI/GII NOT DETECTED  NOT DETECTED Final   Rotavirus A NOT DETECTED NOT DETECTED Final   Sapovirus (I, II, IV, and V) NOT DETECTED NOT DETECTED Final    Comment: Performed at [facility], [address] [place], [place] [address]  C Difficile Quick Screen w PCR reflex     Status: None   Collection Time: 12/16/22  9:47 PM   Specimen: Stool  Result Value Ref Range Status   C Diff antigen NEGATIVE NEGATIVE Final   C Diff toxin NEGATIVE NEGATIVE Final   C Diff interpretation No C. difficile detected.  Final    Comment: Performed at [facility], [address] [place], [place] [address]    Labs: CBC: Recent Labs  Lab 04/19/24 0042  WBC 9.2  NEUTROABS 4.3  HGB 16.2  HCT 47.6  MCV 89.8  PLT 220   Basic Metabolic Panel: Recent Labs  Lab 04/19/24 0042  NA 135  K 3.5  CL 100  CO2 22  GLUCOSE 141*  BUN 23*  CREATININE 1.02  CALCIUM 8.9   Liver Function Tests: Recent Labs  Lab 04/19/24 0042  AST 39  ALT 91*  ALKPHOS 89  BILITOT 0.3  PROT 7.3  ALBUMIN 4.5   CBG: No results for input(s): GLUCAP in the last 168 hours.  Discharge time spent: greater than 30 minutes.  Signed: Aimee Somerset, MD [facility] 04/19/2024 "

## 2024-04-19 NOTE — ED Provider Notes (Signed)
 Daniel Contreras Provider Note    Event Date/Time   First MD Initiated Contact with Patient 04/19/24 [phone]     (approximate)   History   Headache and Visual Field Change   HPI  Daniel Contreras is a 39 y.o. male with history of seizures, presenting with headache and vision changes to the right eye.  Last known well was at 10:30 PM.  States that he has a right sided headache.  Noticed that the vision to his right eye became dark, he denies any flashes or floaters, states the headache came on suddenly but worsened over time.  No prior history of strokes, he denies any blood thinners.  States that he has new weakness and numbness on the right side.  Denies any history of blood thinners.  States that he is no longer on Keppra .  Independent history from wife, he took 2 Excedrin migraines today.  Did offer patient Spanish interpreter but patient states that he does not want an interpreter, is able to speak English.     Physical Exam   Triage Vital Signs: ED Triage Vitals  Encounter Vitals Group     BP 04/19/24 0035 (!) 141/86     Girls Systolic BP Percentile --      Girls Diastolic BP Percentile --      Boys Systolic BP Percentile --      Boys Diastolic BP Percentile --      Pulse Rate 04/19/24 0035 73     Resp 04/19/24 0035 20     Temp 04/19/24 0035 98.2 F (36.8 C)     Temp src --      SpO2 04/19/24 0035 100 %     Weight 04/19/24 0041 191 lb (86.6 kg)     Height --      Head Circumference --      Peak Flow --      Pain Score 04/19/24 0041 10     Pain Loc --      Pain Education --      Exclude from Growth Chart --     Most recent vital signs: Vitals:   04/19/24 0035 04/19/24 0200  BP: (!) 141/86 128/80  Pulse: 73 81  Resp: 20   Temp: 98.2 F (36.8 C)   SpO2: 100% 100%     General: Awake, no distress.  CV:  Good peripheral perfusion.  Resp:  Normal effort.  No tachypnea or respiratory distress Abd:  No distention.  Soft  nontender Other:  No facial droop or slurred speech, pupils are equal and reactive to light, extraocular movements are intact, patient states that he is only able to see movement and light out of his right eye that is new, for his left eye, his upper visual fields as well as his lateral visual fields are intact, his medial lower visual field, when asked how many fingers he sees, he would describe 1 more finger than what is being held up.  He has decree sensation to his right face, right upper and lower extremity, decreased strength to his right upper and lower extremity compared to the left   ED Results / Procedures / Treatments   Labs (all labs ordered are listed, but only abnormal results are displayed) Labs Reviewed  DIFFERENTIAL - Abnormal; Notable for the following components:      Result Value   Monocytes Absolute 1.1 (*)    All other components within normal limits  COMPREHENSIVE METABOLIC PANEL WITH GFR -  Abnormal; Notable for the following components:   Glucose, Bld 141 (*)    BUN 23 (*)    ALT 91 (*)    All other components within normal limits  PROTIME-INR  APTT  CBC  URINE DRUG SCREEN     EKG  EKG shows, sinus rhythm, rate 70, normal QS, normal QTc, no obvious ischemic ST elevation, T wave flattening in 3, not significantly changed compared to prior   RADIOLOGY On my independent interpretation, CT head without obvious intracranial hemorrhage   PROCEDURES:  Critical Care performed: Yes, see critical care procedure note(s)  .Critical Care  Performed by: Waymond Lorelle Cummins, MD Authorized by: Waymond Lorelle Cummins, MD   Critical care provider statement:    Critical care time (minutes):  40   Critical care was time spent personally by me on the following activities:  Development of treatment plan with patient or surrogate, discussions with consultants, evaluation of patient's response to treatment, examination of patient, ordering and review of laboratory studies, ordering and  review of radiographic studies, ordering and performing treatments and interventions, pulse oximetry, re-evaluation of patient's condition and review of old charts    MEDICATIONS ORDERED IN ED: Medications  ondansetron  (ZOFRAN ) injection 4 mg (4 mg Intravenous Given 04/19/24 0146)  iohexol  (OMNIPAQUE ) 350 MG/ML injection 100 mL (100 mLs Intravenous Contrast Given 04/19/24 0207)  metoCLOPramide  (REGLAN ) injection 10 mg (10 mg Intravenous Given 04/19/24 0229)  acetaminophen  (TYLENOL ) tablet 1,000 mg (1,000 mg Oral Given 04/19/24 0227)     IMPRESSION / MDM / ASSESSMENT AND PLAN / ED COURSE  I reviewed the triage vital signs and the nursing notes.                              Differential diagnosis includes, but is not limited to, CVA, TIA, complex migraine, intracranial hemorrhage.  The patient states that his last known well was at 10:30 PM at night, stroke alert was activated.  Labs, CT.  Neurology evaluation.  Patient's presentation is most consistent with acute presentation with potential threat to life or bodily function.  Independent interpretation of labs and imaging below.  CT angio without large vessel occlusion, no evidence of core infarct on CT perfusion.  Will give him migraine cocktail and plan to have admitted for stroke workup.  Consult the hospitalist, he is admitted.  The patient is on the cardiac monitor to evaluate for evidence of arrhythmia and/or significant heart rate changes.   Clinical Course as of 04/19/24 0318  Tue Apr 19, 2024  0136 Received call from radiology, they did not see anything acute in the CT head. [TT]  0139 CT HEAD CODE STROKE WO CONTRAST (LKW 0-4.5h, LVO 0-24h) 1. No acute intracranial abnormality.  ASPECTS 10.  [TT]  0139 Spoke to neurology, he is outside the TNK window, states that they see a left cerebellar lesion, could be a cyst.  Recommended following up CT angio, if negative, admit for MRI and stroke workup [TT]  0238 CT ANGIO HEAD NECK W WO  CM W PERF (CODE STROKE) LKW > 6h IMPRESSION: 1. No large vessel occlusion or hemodynamically significant stenosis. 2. No evidence of core infarct or penumbra by CT brain perfusion.   [TT]  9761 Independent review of labs, no leukocytosis, electrolytes not severely deranged, coagulation panel is not elevated. [TT]    Clinical Course User Index [TT] Waymond Lorelle Cummins, MD     FINAL CLINICAL IMPRESSION(S) / ED  DIAGNOSES   Final diagnoses:  Nonintractable headache, unspecified chronicity pattern, unspecified headache type  Change in vision  Weakness  Sensory deficit, right     Rx / DC Orders   ED Discharge Orders     None        Note:  This document was prepared using Dragon voice recognition software and may include unintentional dictation errors.    Waymond Lorelle Cummins, MD 04/19/24 [id number]

## 2024-04-19 NOTE — Progress Notes (Signed)
 SLP Cancellation Note  Patient Details Name: Daniel Contreras MRN: [id number] DOB: 03-04-1986   Cancelled treatment:       Reason Eval/Treat Not Completed:  (chart reviewed; consulted NSG and met w/ pt/Wife in room. Pt does speak general English; may need a Research [employer] for medical discussions. Informed pt that could be provided; he agreed.)  Pt is a 39 y.o. male with medical history significant for Seizure disorder no longer on AEDs being admitted for stroke workup after presenting with sudden onset headache associated with visual disturbance and right-sided numbness and weakness. Outside tPA window per MD note.   Pt denied any difficulty swallowing and is currently on a regular diet; tolerates swallowing pills w/ water per NSG. He has been drinking water in the room and is awaiting his breakfast meal. Pt conversed in basic conversation in English w/out overt expressive/receptive deficits noted; Wife and pt denied any speech-language deficits. Speech clear, fully intelligible to Wife. Pt had a breakfast request after discussion of the Menu items; this was called to [employer] for him. He also requested coffee for himself and Wife w/ condiments; this was provided.  No further skilled ST services indicated as pt appears at his baseline. Pt agreed. NSG to reconsult if any change in status while admitted.        Comer Portugal, MS, CCC-SLP Speech Language Pathologist Rehab Services; [facility] [phone] (ascom) Rhian Asebedo 04/19/2024, 9:12 AM

## 2024-04-19 NOTE — Plan of Care (Signed)
" °  Problem: Education: Goal: Knowledge of disease or condition will improve Outcome: Progressing   Problem: Coping: Goal: Will verbalize positive feelings about self Outcome: Progressing   Problem: Self-Care: Goal: Ability to participate in self-care as condition permits will improve Outcome: Progressing   Problem: Nutrition: Goal: Risk of aspiration will decrease Outcome: Progressing   Problem: Education: Goal: Knowledge of disease or condition will improve Outcome: Progressing   Problem: Pain Managment: Goal: General experience of comfort will improve and/or be controlled Outcome: Progressing   Problem: Safety: Goal: Ability to remain free from injury will improve Outcome: Progressing   "

## 2024-04-19 NOTE — Hospital Course (Signed)
 Daniel Contreras

## 2024-04-19 NOTE — Progress Notes (Signed)
*  PRELIMINARY RESULTS* Echocardiogram 2D Echocardiogram has been performed.  Daniel Contreras 04/19/2024, 1:55 PM

## 2024-04-19 NOTE — ED Notes (Signed)
 Per ems pt called out for sick person ems states they could not get any information out of pt so they have no additional information/. VSS.

## 2024-04-21 ENCOUNTER — Telehealth: Payer: Self-pay

## 2024-04-21 LAB — HIV-1/2 AB - DIFFERENTIATION
HIV 1 Ab: REACTIVE
HIV 2 Ab: NONREACTIVE

## 2024-04-21 NOTE — Telephone Encounter (Signed)
 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   Referral faxed to [facility] to assist with notification and linkage to care as it's unclear if patient was aware he was tested during most recent admission.   Tra Wilemon, BSN, RN

## 2024-04-27 IMAGING — DX DG CHEST 1V PORT
1 series · 1 of 1 positions shown · non-contrast
Comparison: None Available.

CLINICAL DATA: Chest pain

EXAM:
PORTABLE CHEST 1 VIEW

[chest ap]
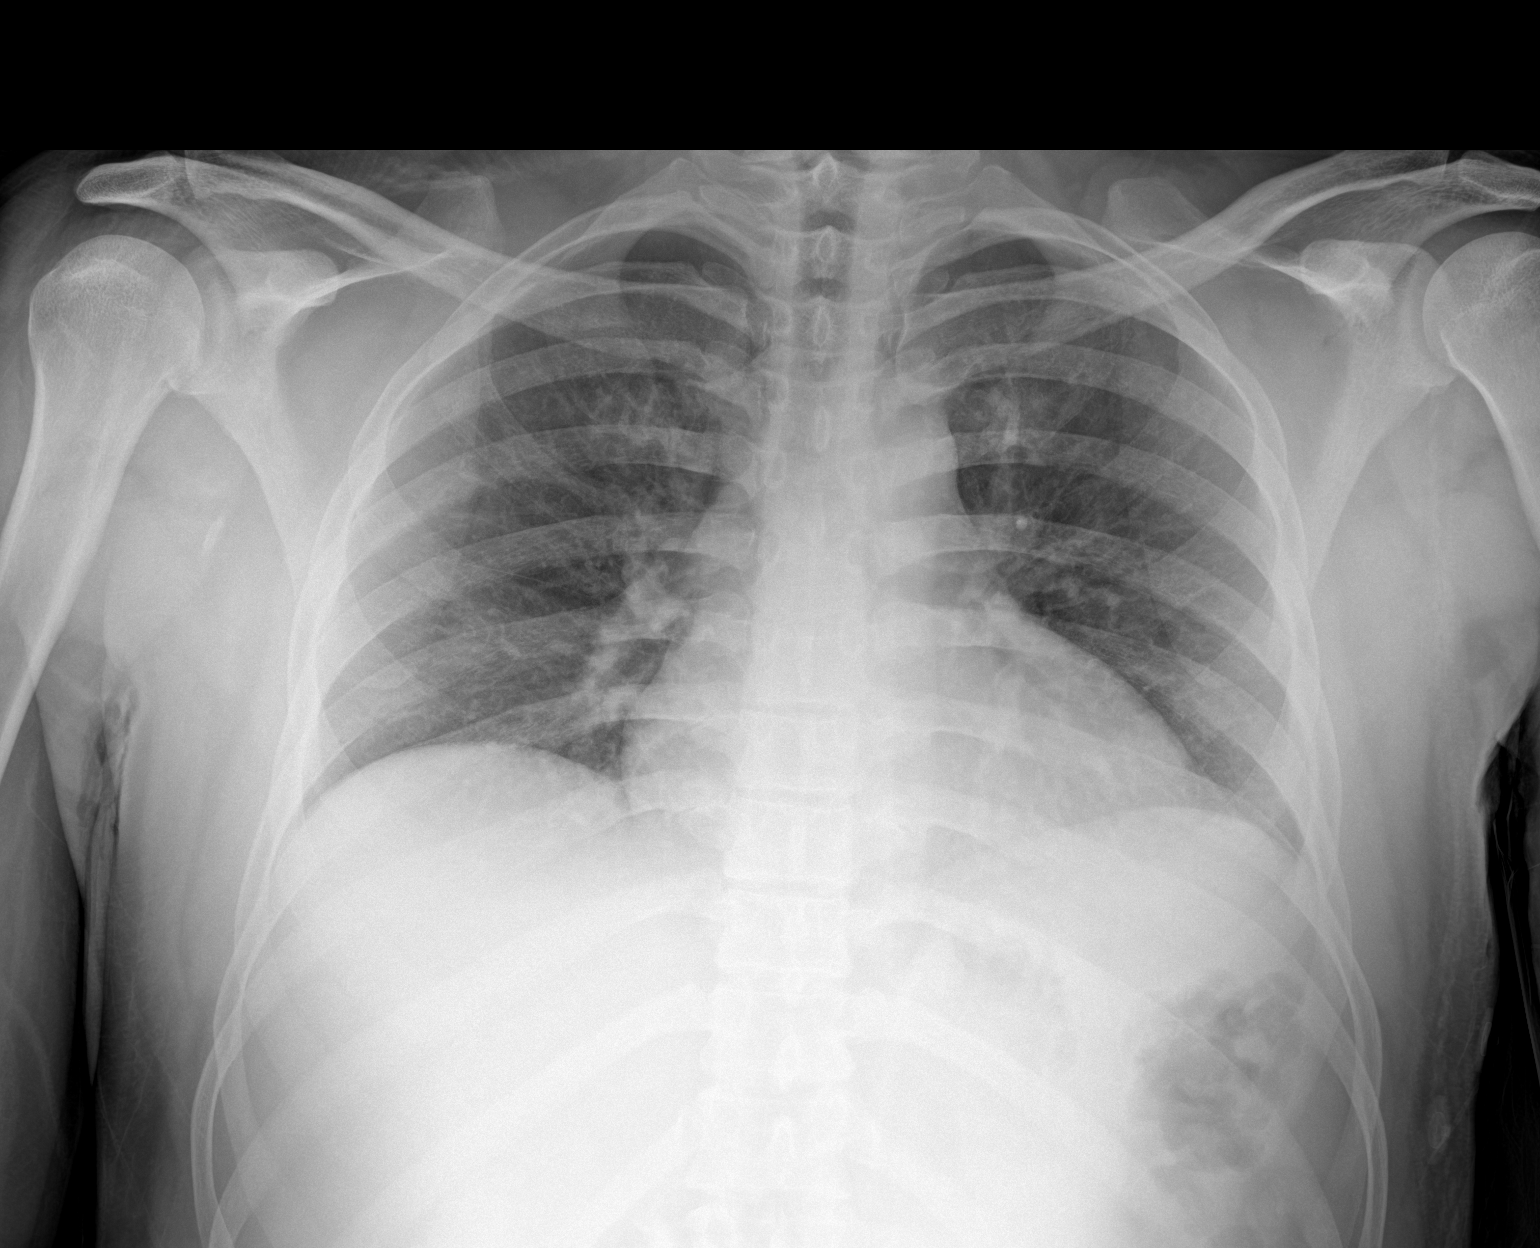

[1 of 1 positions shown; findings below may reference images not displayed]

FINDINGS: Lungs volumes are small, but are symmetric and are clear. No
pneumothorax or pleural effusion. Cardiac size within normal limits.
Pulmonary vascularity is normal. Osseous structures are
age-appropriate. No acute bone abnormality.
IMPRESSION: No active disease.

## 2024-04-28 ENCOUNTER — Encounter: Payer: Self-pay | Admitting: *Deleted

## 2024-04-28 ENCOUNTER — Emergency Department
Admission: EM | Admit: 2024-04-28 | Discharge: 2024-04-28 | Disposition: A | Discharge status: Home / Self Care | Payer: Self-pay | Attending: Emergency Medicine | Admitting: Emergency Medicine

## 2024-04-28 ENCOUNTER — Other Ambulatory Visit: Payer: Self-pay

## 2024-04-28 DIAGNOSIS — R42 Dizziness and giddiness: Secondary | ICD-10-CM | POA: Insufficient documentation

## 2024-04-28 DIAGNOSIS — Z21 Asymptomatic human immunodeficiency virus [HIV] infection status: Secondary | ICD-10-CM | POA: Insufficient documentation

## 2024-04-28 LAB — BASIC METABOLIC PANEL WITH GFR
Anion gap: 12 (ref 5–15)
BUN: 20 mg/dL (ref 6–20)
CO2: 24 mmol/L (ref 22–32)
Calcium: 9.1 mg/dL (ref 8.9–10.3)
Chloride: 100 mmol/L (ref 98–111)
Creatinine, Ser: 0.91 mg/dL (ref 0.61–1.24)
GFR, Estimated: >60 mL/min
Glucose, Bld: 106 mg/dL — ABNORMAL HIGH (ref 70–99)
Potassium: 3.9 mmol/L (ref 3.5–5.1)
Sodium: 135 mmol/L (ref 135–145)

## 2024-04-28 LAB — CBC
HCT: 47.4 % (ref 39.0–52.0)
Hemoglobin: 16.5 g/dL (ref 13.0–17.0)
MCH: 30.8 pg (ref 26.0–34.0)
MCHC: 34.8 g/dL (ref 30.0–36.0)
MCV: 88.6 fL (ref 80.0–100.0)
Platelets: 220 10*3/uL (ref 150–400)
RBC: 5.35 MIL/uL (ref 4.22–5.81)
RDW: 12.6 % (ref 11.5–15.5)
WBC: 9.6 10*3/uL (ref 4.0–10.5)
nRBC: 0 % (ref 0.0–0.2)

## 2024-04-28 IMAGING — CT CT ABD-PELV W/ CM
2 of 4 series · 15 of 46 positions shown, 17 images · IV contrast (APPLIED)
Comparison: CT the pelvis 11/16/2019. CT the abdomen and pelvis
12/21/2017.

CLINICAL DATA: 36-year-old male with history of epigastric pain and
severe pain in the left chest.

EXAM:
CT ABDOMEN AND PELVIS WITH CONTRAST
TECHNIQUE: Multidetector CT imaging of the abdomen and pelvis was performed
using the standard protocol following bolus administration of
intravenous contrast.

[Series 2: abdomen 5.0 · axial · 0.73mm/px · z∈[-959,-524]mm · 12 of 99 slices shown, 14 images]
[im 6/99  soft-tissue]
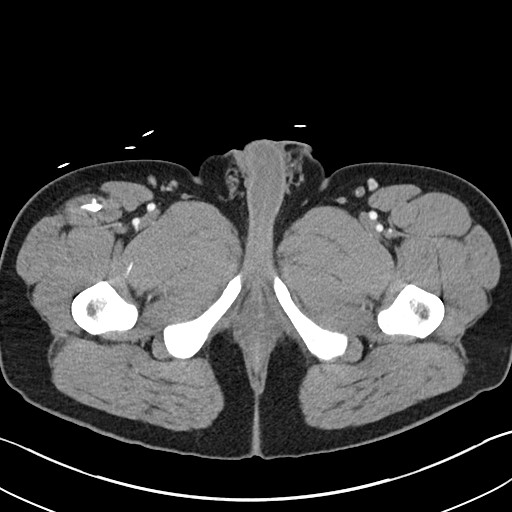
[im 6/99  bone]
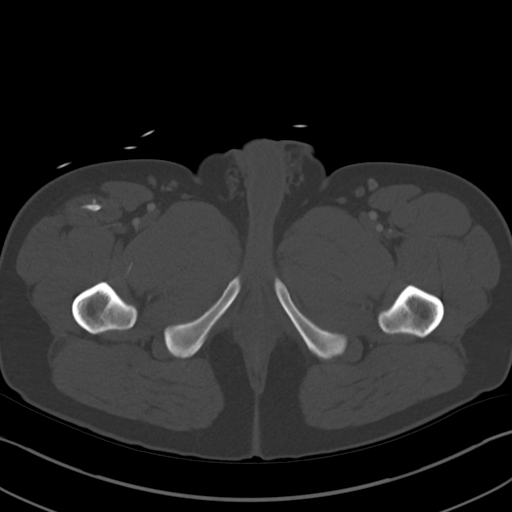
[im 18/99  soft-tissue]
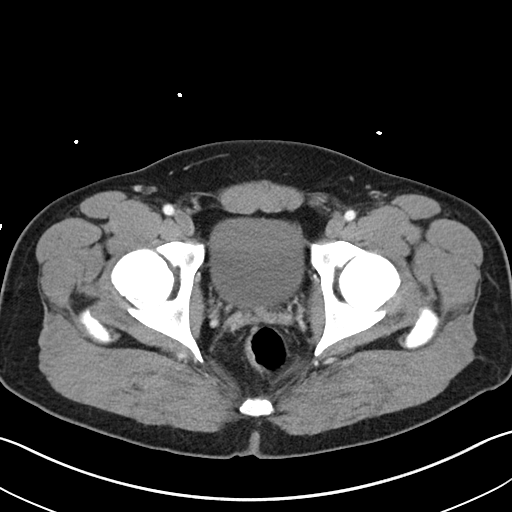
[im 24/99  soft-tissue]
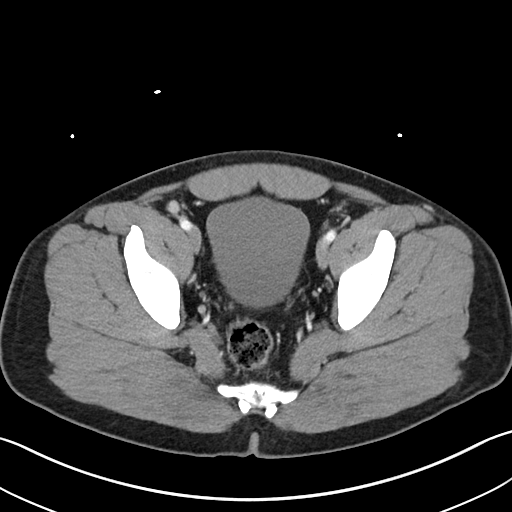
[im 29/99  soft-tissue]
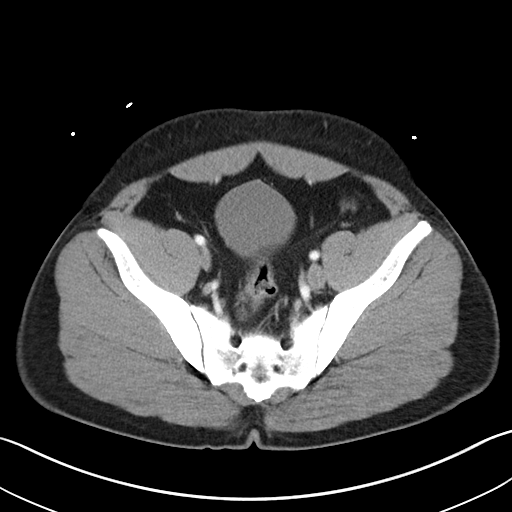
[im 41/99  soft-tissue]
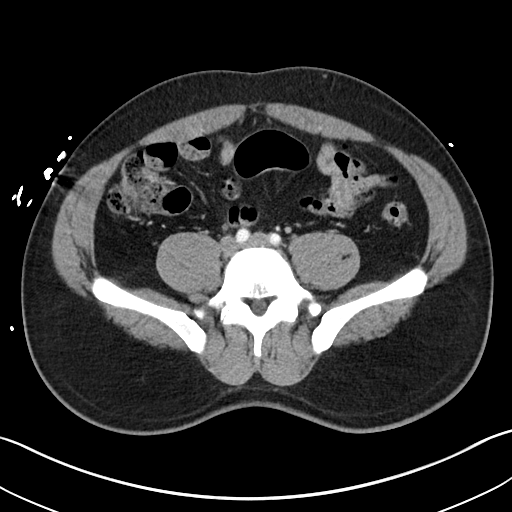
[im 47/99  soft-tissue]
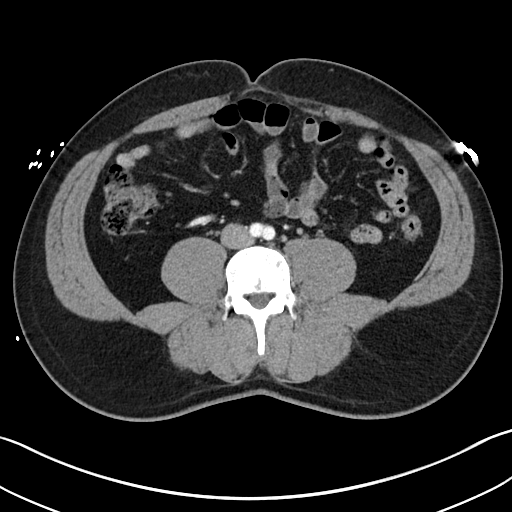
[im 52/99  soft-tissue]
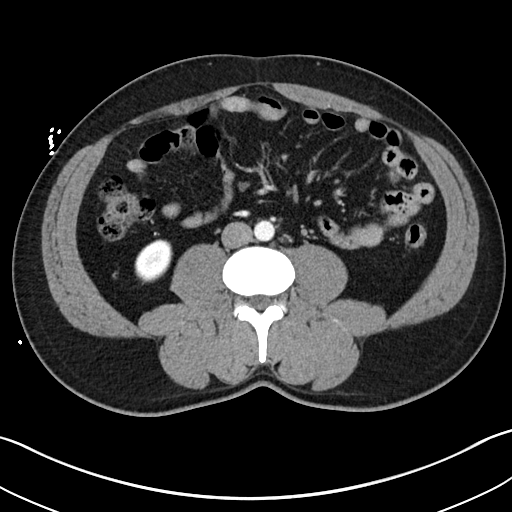
[im 64/99  soft-tissue]
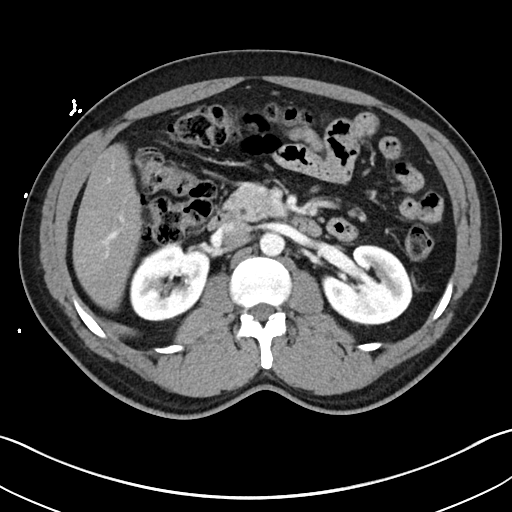
[im 70/99  soft-tissue]
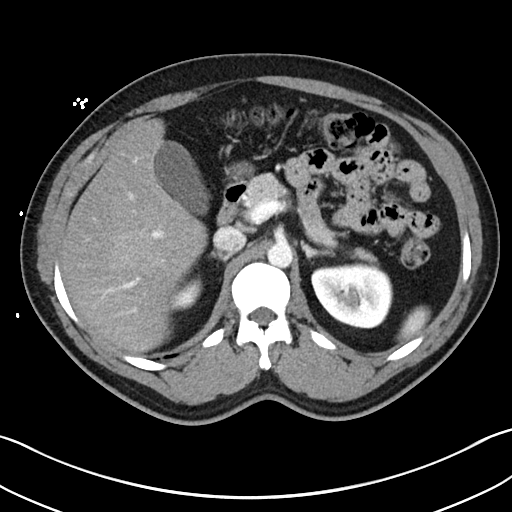
[im 70/99  bone]
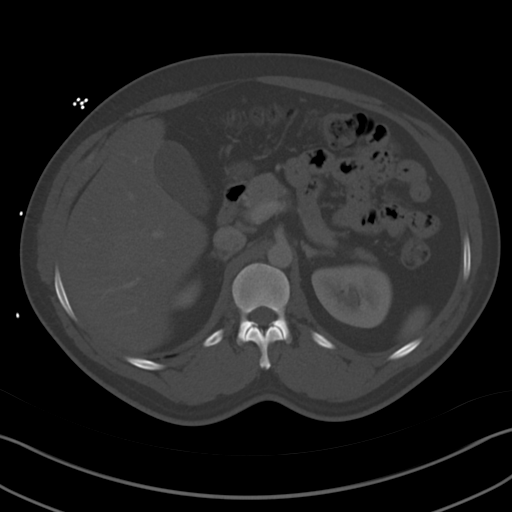
[im 75/99  soft-tissue]
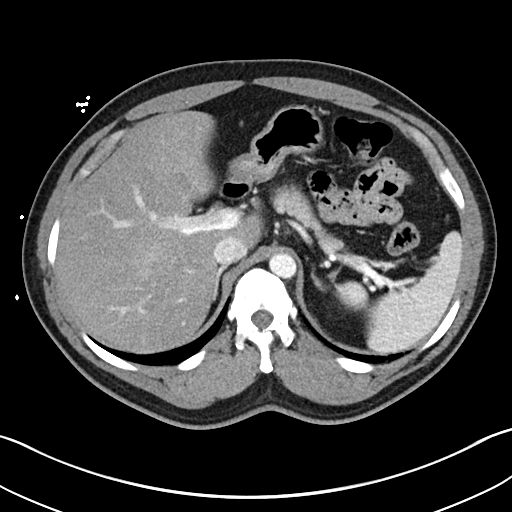
[im 87/99  soft-tissue]
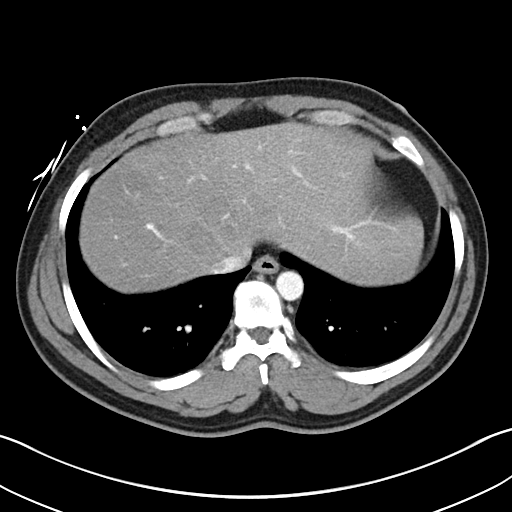
[im 93/99  soft-tissue]
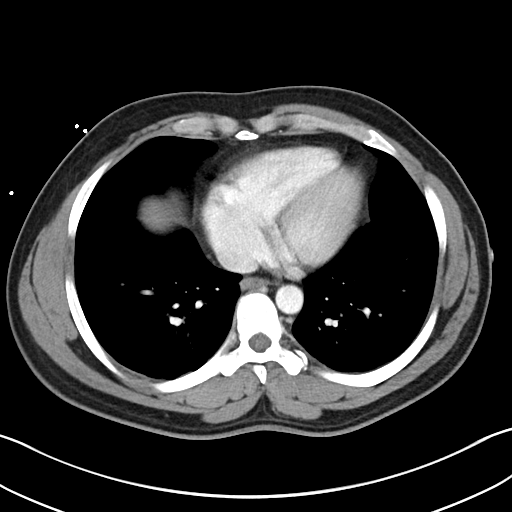

[Series 5: abdomen 3.0 mpr cor · coronal · 0.74mm/px · 3 of 102 slices shown]
[im 34/102  soft-tissue]
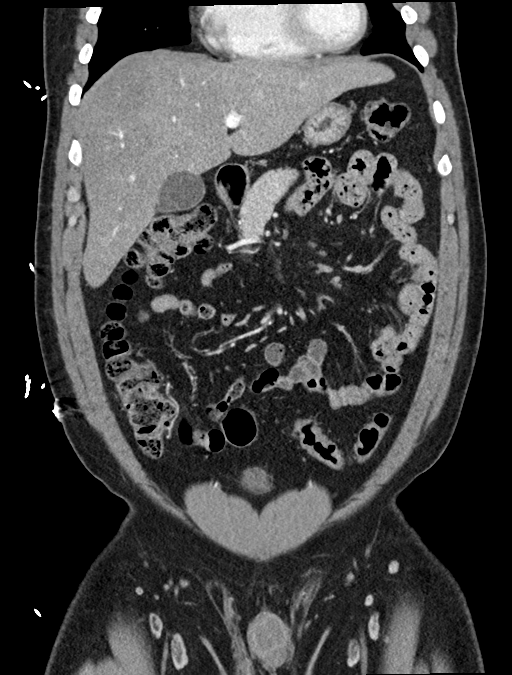
[im 45/102  soft-tissue]
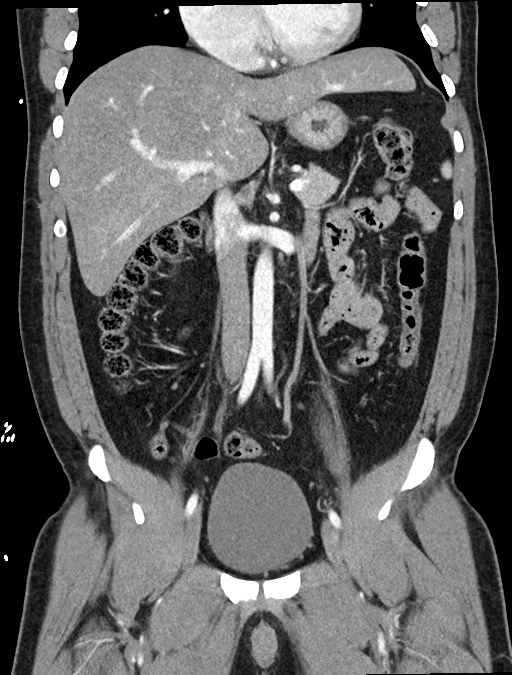
[im 57/102  soft-tissue]
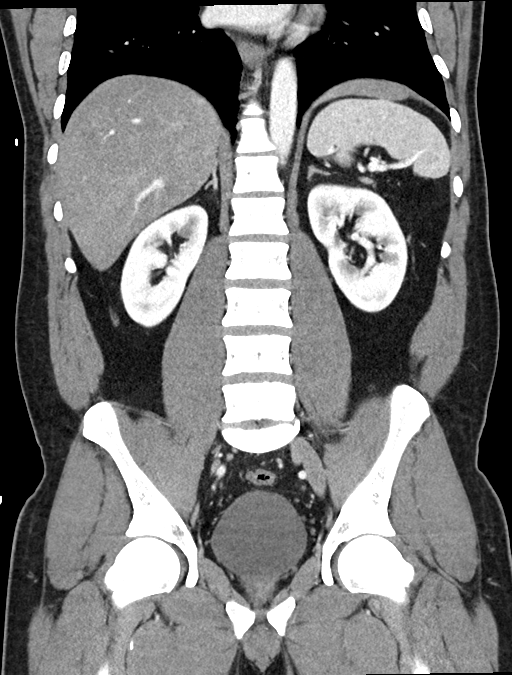

[15 of 46 positions shown; findings below may reference images not displayed]

RADIATION DOSE REDUCTION: This exam was performed according to the
departmental dose-optimization program which includes automated
exposure control, adjustment of the mA and/or kV according to
patient size and/or use of iterative reconstruction technique.

CONTRAST:  100mL OMNIPAQUE IOHEXOL 300 MG/ML  SOLN
FINDINGS: Lower chest: Unremarkable.

Hepatobiliary: Diffuse low attenuation throughout the hepatic
parenchyma, indicative of a background of severe hepatic steatosis.
No suspicious cystic or solid hepatic lesions. No intra or
extrahepatic biliary ductal dilatation. Gallbladder is normal in
appearance.

Pancreas: No pancreatic mass. No pancreatic ductal dilatation. No
pancreatic or peripancreatic fluid collections or inflammatory
changes.

Spleen: Unremarkable.

Adrenals/Urinary Tract: Bilateral kidneys and adrenal glands are
normal in appearance. No hydroureteronephrosis. Urinary bladder is
normal in appearance.

Stomach/Bowel: The appearance of the stomach is normal. There is no
pathologic dilatation of small bowel or colon. Normal appendix.

Vascular/Lymphatic: No significant atherosclerotic disease, aneurysm
or dissection noted in the abdominal or pelvic vasculature. No
lymphadenopathy noted in the abdomen or pelvis.

Reproductive: Prostate gland and seminal vesicles are unremarkable
in appearance.

Other: No significant volume of ascites.  No pneumoperitoneum.

Musculoskeletal: Dystrophic calcifications noted in the proximal
right quadriceps musculature, incompletely imaged, presumably
related to remote trauma (similar to the prior study). There are no
aggressive appearing lytic or blastic lesions noted in the
visualized portions of the skeleton.
IMPRESSION: 1. No acute findings are noted in the abdomen or pelvis to account
for the patient's symptoms.
2. However, there is severe hepatic steatosis.

## 2024-04-28 NOTE — ED Triage Notes (Signed)
 Pt ambulatory to triage.  Pt reports he got a call today telling him is HIV positive.  Pt became upset with n/v  pt recently discharged from [facility]   pt alert.

## 2024-04-28 NOTE — ED Provider Notes (Signed)
 "  [facility] Provider Note    Event Date/Time   First MD Initiated Contact with Patient 04/28/24 2140     (approximate)   History   Abnormal Lab   HPI  Daniel Contreras is a 39 y.o. male who presents with complaints of dizziness.  Patient reports that he was informed that he was diagnosed with HIV today by telephone while he was at work, he felt lightheaded and then nauseated.  Now he is feeling improved but still quite anxious about diagnosis understandably.  Medical records reviewed, telephone call noted confirming positive HIV     Physical Exam   Triage Vital Signs: ED Triage Vitals  Encounter Vitals Group     BP 04/28/24 1925 (!) 141/98     Girls Systolic BP Percentile --      Girls Diastolic BP Percentile --      Boys Systolic BP Percentile --      Boys Diastolic BP Percentile --      Pulse Rate 04/28/24 1925 93     Resp 04/28/24 1925 18     Temp 04/28/24 1925 97.7 F (36.5 C)     Temp Source 04/28/24 1925 Oral     SpO2 04/28/24 1925 98 %     Weight 04/28/24 1926 86.2 kg (190 lb)     Height 04/28/24 1926 1.727 m (5' 8)     Head Circumference --      Peak Flow --      Pain Score 04/28/24 1926 0     Pain Loc --      Pain Education --      Exclude from Growth Chart --     Most recent vital signs: Vitals:   04/28/24 1925  BP: (!) 141/98  Pulse: 93  Resp: 18  Temp: 97.7 F (36.5 C)  SpO2: 98%     General: Awake, no distress.  CV:  Good peripheral perfusion.  Resp:  Normal effort.  Abd:  No distention.  Other:     ED Results / Procedures / Treatments   Labs (all labs ordered are listed, but only abnormal results are displayed) Labs Reviewed  BASIC METABOLIC PANEL WITH GFR - Abnormal; Notable for the following components:      Result Value   Glucose, Bld 106 (*)    All other components within normal limits  CBC     EKG     RADIOLOGY     PROCEDURES:  Critical Care performed:    Procedures   MEDICATIONS ORDERED IN ED: Medications - No data to display   IMPRESSION / MDM / ASSESSMENT AND PLAN / ED COURSE  I reviewed the triage vital signs and the nursing notes. Patient's presentation is most consistent with acute illness / injury with system symptoms.  Patient presents with dizziness as detailed above, overall well-appearing here with mildly elevated blood pressure  Lab work reviewed and is unremarkable, his exam is unremarkable  This sounds like a stress reaction given the timing of his symptoms and the relatively rapid resolution.  No indication for admission at this time.  Outpatient follow-up at the [facility] center for further management of new diagnosed HIV        FINAL CLINICAL IMPRESSION(S) / ED DIAGNOSES   Final diagnoses:  Dizziness     Rx / DC Orders   ED Discharge Orders     None        Note:  This document was prepared using Dragon voice  recognition software and may include unintentional dictation errors.   Arlander Charleston, MD 04/28/24 2237  "

## 2024-04-28 NOTE — Progress Notes (Signed)
 Client and his partner arrived at the [facility] just after 5 pm looking for assistance related to HIV testing results received from [facility].  Kathlynn Jubilee, RN assisted with translation.  Client was referred to [facility].  Attempted to contact [employer] to assist with scheduling an appointment, but after 20 minutes on hold, client decided he would just drive to [facility].  They are open until 8pm this evening.   Communicable Disease Nurse Supervisor also made aware so that she can assist with any follow care coordination that might be needed.  Kratos Ruscitti JONELLE Edis, RN

## 2024-04-29 ENCOUNTER — Telehealth: Payer: Self-pay

## 2024-04-29 NOTE — Telephone Encounter (Signed)
 Patient has been notified of new HIV diagnosis. Called to review next steps and connect him to care. Call could not be completed.   Morrison Masser, BSN, RN

## 2024-05-04 ENCOUNTER — Telehealth: Payer: Self-pay

## 2024-05-04 NOTE — Telephone Encounter (Signed)
 Called patient to offer new B20 appointment, call could not be completed.   Haze Antillon, BSN, RN
